# Patient Record
Sex: Male | Born: 1935 | Race: White | Hispanic: No | Marital: Married | Smoking: Current every day smoker
Health system: Southern US, Community
[De-identification: ages and names within clinical notes are randomized; demographics above are authoritative.]

## PROBLEM LIST (undated history)

## (undated) DIAGNOSIS — H269 Unspecified cataract: Secondary | ICD-10-CM

## (undated) DIAGNOSIS — J449 Chronic obstructive pulmonary disease, unspecified: Secondary | ICD-10-CM

## (undated) DIAGNOSIS — H9190 Unspecified hearing loss, unspecified ear: Secondary | ICD-10-CM

## (undated) DIAGNOSIS — IMO0001 Reserved for inherently not codable concepts without codable children: Secondary | ICD-10-CM

## (undated) HISTORY — PX: SHOULDER SURGERY: SHX246

## (undated) HISTORY — PX: CATARACT EXTRACTION: SUR2

## (undated) HISTORY — PX: KNEE SURGERY: SHX244

## (undated) HISTORY — PX: BACK SURGERY: SHX140

## (undated) HISTORY — DX: Unspecified cataract: H26.9

---

## 2014-07-08 DEATH — deceased

## 2015-01-09 ENCOUNTER — Other Ambulatory Visit: Payer: Self-pay | Admitting: Internal Medicine

## 2015-01-09 ENCOUNTER — Ambulatory Visit
Admission: RE | Admit: 2015-01-09 | Discharge: 2015-01-09 | Disposition: A | Discharge status: Home / Self Care | Payer: Medicare Other | Source: Ambulatory Visit | Attending: Internal Medicine | Admitting: Internal Medicine

## 2015-01-09 DIAGNOSIS — R05 Cough: Secondary | ICD-10-CM | POA: Diagnosis present

## 2015-01-09 DIAGNOSIS — R634 Abnormal weight loss: Secondary | ICD-10-CM | POA: Diagnosis present

## 2015-01-09 DIAGNOSIS — R918 Other nonspecific abnormal finding of lung field: Secondary | ICD-10-CM | POA: Insufficient documentation

## 2015-01-09 DIAGNOSIS — R059 Cough, unspecified: Secondary | ICD-10-CM

## 2015-01-20 ENCOUNTER — Other Ambulatory Visit: Payer: Self-pay | Admitting: Internal Medicine

## 2015-01-20 DIAGNOSIS — R918 Other nonspecific abnormal finding of lung field: Secondary | ICD-10-CM

## 2015-01-23 ENCOUNTER — Ambulatory Visit
Admission: RE | Admit: 2015-01-23 | Discharge: 2015-01-23 | Disposition: A | Discharge status: Home / Self Care | Payer: Medicare Other | Source: Ambulatory Visit | Attending: Internal Medicine | Admitting: Internal Medicine

## 2015-01-23 DIAGNOSIS — J439 Emphysema, unspecified: Secondary | ICD-10-CM | POA: Diagnosis not present

## 2015-01-23 DIAGNOSIS — I7090 Unspecified atherosclerosis: Secondary | ICD-10-CM | POA: Diagnosis not present

## 2015-01-23 DIAGNOSIS — R918 Other nonspecific abnormal finding of lung field: Secondary | ICD-10-CM | POA: Diagnosis present

## 2015-01-23 MED ORDER — IOHEXOL 300 MG/ML  SOLN
75.0000 mL | Freq: Once | INTRAMUSCULAR | Status: AC | PRN
  Administered 2015-01-23: 75 mL via INTRAVENOUS

## 2015-01-28 ENCOUNTER — Encounter: Payer: Self-pay | Admitting: Oncology

## 2015-01-28 ENCOUNTER — Inpatient Hospital Stay: Payer: Medicare Other | Attending: Oncology | Admitting: Oncology

## 2015-01-28 VITALS — BP 152/89 | HR 90 | Temp 96.3°F | Resp 16 | Ht 68.7 in | Wt 120.2 lb

## 2015-01-28 DIAGNOSIS — R911 Solitary pulmonary nodule: Secondary | ICD-10-CM

## 2015-01-28 DIAGNOSIS — R0602 Shortness of breath: Secondary | ICD-10-CM | POA: Diagnosis not present

## 2015-01-28 DIAGNOSIS — H9190 Unspecified hearing loss, unspecified ear: Secondary | ICD-10-CM | POA: Insufficient documentation

## 2015-01-28 DIAGNOSIS — Z79899 Other long term (current) drug therapy: Secondary | ICD-10-CM | POA: Diagnosis not present

## 2015-01-28 DIAGNOSIS — Z7982 Long term (current) use of aspirin: Secondary | ICD-10-CM | POA: Diagnosis not present

## 2015-01-28 DIAGNOSIS — R079 Chest pain, unspecified: Secondary | ICD-10-CM | POA: Insufficient documentation

## 2015-01-28 DIAGNOSIS — R634 Abnormal weight loss: Secondary | ICD-10-CM | POA: Diagnosis not present

## 2015-01-28 DIAGNOSIS — J449 Chronic obstructive pulmonary disease, unspecified: Secondary | ICD-10-CM | POA: Insufficient documentation

## 2015-01-28 DIAGNOSIS — R918 Other nonspecific abnormal finding of lung field: Secondary | ICD-10-CM | POA: Diagnosis not present

## 2015-01-28 DIAGNOSIS — F17210 Nicotine dependence, cigarettes, uncomplicated: Secondary | ICD-10-CM | POA: Insufficient documentation

## 2015-01-28 DIAGNOSIS — F0390 Unspecified dementia without behavioral disturbance: Secondary | ICD-10-CM | POA: Diagnosis not present

## 2015-01-28 NOTE — Progress Notes (Signed)
Patient's daughter and wife spoke with me individually today, he is not aware of the mass that was seen recent CT.  The family would like for Dr. Grayland Ormond not to mention his diagnosis to the patient because he is not aware.

## 2015-01-29 ENCOUNTER — Telehealth: Payer: Self-pay | Admitting: *Deleted

## 2015-01-29 NOTE — Telephone Encounter (Signed)
  Oncology Nurse Navigator Documentation    Navigator Encounter Type: Introductory phone call (01/29/15 0900)               Phone call to spouse as followup from initial medical oncology appointment. Introduced Programmer, multimedia and reviewed plan of care. Will follow and assist in setting up navigation bronchoscopy.

## 2015-02-03 ENCOUNTER — Other Ambulatory Visit: Payer: Medicare Other

## 2015-02-04 ENCOUNTER — Encounter: Payer: Self-pay | Admitting: Internal Medicine

## 2015-02-04 ENCOUNTER — Encounter: Payer: Self-pay | Admitting: *Deleted

## 2015-02-04 ENCOUNTER — Ambulatory Visit (INDEPENDENT_AMBULATORY_CARE_PROVIDER_SITE_OTHER): Payer: Medicare Other | Admitting: Internal Medicine

## 2015-02-04 VITALS — BP 108/68 | HR 97 | Ht 68.0 in | Wt 120.0 lb

## 2015-02-04 DIAGNOSIS — J439 Emphysema, unspecified: Secondary | ICD-10-CM

## 2015-02-04 DIAGNOSIS — R918 Other nonspecific abnormal finding of lung field: Secondary | ICD-10-CM

## 2015-02-04 MED ORDER — TIOTROPIUM BROMIDE MONOHYDRATE 18 MCG IN CAPS: 18.0000 ug | ORAL_CAPSULE | Freq: Every day | RESPIRATORY_TRACT | Status: AC

## 2015-02-04 MED ORDER — ALBUTEROL SULFATE HFA 108 (90 BASE) MCG/ACT IN AERS: 2.0000 | INHALATION_SPRAY | Freq: Four times a day (QID) | RESPIRATORY_TRACT | Status: AC | PRN

## 2015-02-04 MED ORDER — MOMETASONE FURO-FORMOTEROL FUM 200-5 MCG/ACT IN AERO: 2.0000 | INHALATION_SPRAY | Freq: Two times a day (BID) | RESPIRATORY_TRACT | Status: AC

## 2015-02-04 NOTE — Progress Notes (Signed)
[facility] Consultation      Date: 02/04/2015,   MRN# [id number] Brian Coleman [birth year] Code Status:  Hosp day:'@LENGTHOFSTAYDAYS'$ @ Referring MD: '@ATDPROV'$ @     PCP:      AdmissionWeight: 120 lb (54.432 kg)                 CurrentWeight: 120 lb (54.432 kg) Brian Coleman is a 81 y.o. old male seen in consultation for Lung mass    CHIEF COMPLAINT:   Abnormal CT chest  a  HISTORY OF PRESENT ILLNESS   81 yo white male seen today for abnormal CT chest, patient with spiculated RUL mass Patient has been having Some SOB for several months, CXR shows RUL abnormality Patient has increased WOb that resolves with rest, he has no acute issues at this time   Patient saw Dr Grayland Ormond last week and was seen today for Ct chest which was reviewed with family-i have showed them areas of emphysema and the mass Patient has had approx 25 pound weight loss over the last several years, most of information was obtained by Wife and daughter  Patient has no fevers, chills, NVD   PAST MEDICAL HISTORY   Past Medical History  Diagnosis Date  . Cataract      SURGICAL HISTORY   Past Surgical History  Procedure Laterality Date  . Knee surgery       FAMILY HISTORY   No family history on file.   SOCIAL HISTORY   Social History  Substance Use Topics  . Smoking status: Current Every Day Smoker -- 0.75 packs/day for 60 years    Types: Cigarettes  . Smokeless tobacco: Never Used  . Alcohol Use: No     MEDICATIONS    Home Medication:  Current Outpatient Rx  Name  Route  Sig  Dispense  Refill  . aspirin 81 MG tablet   Oral   Take 81 mg by mouth daily.           Current Medication:  Current outpatient prescriptions:  .  aspirin 81 MG tablet, Take 81 mg by mouth daily., Disp: , Rfl:     ALLERGIES   Codeine     REVIEW OF SYSTEMS   Review of Systems  Constitutional: Negative for fever, chills, weight loss, malaise/fatigue and diaphoresis.    HENT: Negative for congestion and hearing loss.   Eyes: Negative for blurred vision and double vision.  Respiratory: Positive for shortness of breath. Negative for cough and wheezing.   Cardiovascular: Negative for chest pain, palpitations and orthopnea.  Gastrointestinal: Negative for heartburn, nausea, vomiting, abdominal pain, diarrhea, constipation and blood in stool.  Genitourinary: Negative for dysuria and urgency.  Musculoskeletal: Negative for myalgias, back pain and neck pain.  Skin: Negative for rash.  Neurological: Negative for dizziness, tingling, tremors, weakness and headaches.  Endo/Heme/Allergies: Does not bruise/bleed easily.  Psychiatric/Behavioral: Negative for depression, suicidal ideas and substance abuse.  All other systems reviewed and are negative.    VS: Ht '5\' 8"'$  (1.727 m)  Wt 120 lb (54.432 kg)  BMI 18.25 kg/m2     PHYSICAL EXAM  Physical Exam  Constitutional: He is oriented to person, place, and time. He appears well-developed and well-nourished. No distress.  Thin and cachectic  HENT:  Head: Normocephalic and atraumatic.  Mouth/Throat: No oropharyngeal exudate.  Eyes: EOM are normal. Pupils are equal, round, and reactive to light. No scleral icterus.  Neck: Normal range of motion. Neck supple.  Cardiovascular: Normal rate, regular rhythm and  normal heart sounds.   No murmur heard. Pulmonary/Chest: No stridor. No respiratory distress. He has no wheezes.  Abdominal: Soft. Bowel sounds are normal. He exhibits no distension. There is no tenderness. There is no rebound.  Musculoskeletal: Normal range of motion. He exhibits no edema.  Neurological: He is alert and oriented to person, place, and time. He displays normal reflexes. Coordination normal.  Skin: Skin is warm. He is not diaphoretic.  Psychiatric: He has a normal mood and affect.        LABS    No results for input(s): HGB, HCT, MCV, WBC, POTASSIUM, CHLORIDE, BUN, CREATININE, GLUCOSE,  CALCIUM, INR, PTT in the last 72 hours.  Invalid input(s): PLATELET, BANDS, NEUTROPHIL, LYMPHOCYTE, MONOCYTE, EOSINOPHILS, BASOPHIL, SODIUM, BICARBONATE, MAGNESIUM, PHOSPHORUS, PT, SGPT, SGOT,    No results for input(s): PH in the last 72 hours.  Invalid input(s): PCO2, PO2, BASEEXCESS, BASEDEFICITE, TFT    CULTURE RESULTS   No results found for this or any previous visit (from the past 240 hour(s)).        IMAGING    Dg Chest 2 View  01/10/2015   CLINICAL DATA:  Shortness of breath and weight loss  EXAM: CHEST  2 VIEW  COMPARISON:  None.  FINDINGS: There is an irregular nodular opacity in the right upper lobe, anterior segment, measuring 3.9 x 2.8 x 2.5 cm. There is evidence of underlying COPD. Lungs elsewhere clear. The heart size is normal. Pulmonary vascular reflects underlying COPD. No adenopathy. There is degenerative change in the thoracic spine.  IMPRESSION: Irregular opacity in the anterior segment right upper lobe concerning for neoplasm. Advise contrast enhanced chest CT to further evaluate. Underlying COPD.  These results will be called to the ordering clinician or representative by the Radiologist Assistant, and communication documented in the PACS or zVision Dashboard.   Electronically Signed   By: Lowella Grip III M.D.   On: 01/10/2015 08:04   Ct Chest W Contrast  01/23/2015   CLINICAL DATA:  Irregular right upper lobe mass on chest radiographs, concerning for malignancy. No history of malignancy. Patient fell 2 weeks ago. Initial encounter.  EXAM: CT CHEST WITH CONTRAST  TECHNIQUE: Multidetector CT imaging of the chest was performed during intravenous contrast administration.  CONTRAST:  46m OMNIPAQUE IOHEXOL 300 MG/ML  SOLN  COMPARISON:  Chest radiographs 01/09/2015.  FINDINGS: Mediastinum/Nodes: There are no enlarged mediastinal, hilar or axillary lymph nodes. There are probable dependent secretions within the trachea and right mainstem bronchus. The thyroid gland and  esophagus demonstrate no significant findings. The heart size is normal. There is no pericardial effusion. There is atherosclerosis of the aorta, great vessels and coronary arteries.  Lungs/Pleura: There is no pleural effusion. Moderate diffuse changes of centrilobular and paraseptal emphysema. As demonstrated radiographically, there is an irregular right upper lobe mass which measures 3.3 x 4.2 x 2.9 cm, consistent with bronchogenic carcinoma. No other suspicious pulmonary nodules. There is mild central airway thickening.  Upper abdomen: Multiple well-circumscribed water density hepatic lesions are consistent with cysts. Some of these are too small to optimally characterize, but no suspicious lesions demonstrated. No evidence of adrenal mass.  Musculoskeletal/Chest wall: There is no chest wall mass or suspicious osseous finding. Mild thoracic spine degenerative changes are present.  IMPRESSION: 1. Spiculated right upper lobe mass consistent with bronchogenic carcinoma. No evidence of metastatic disease. 2. Moderate underlying emphysema. 3. Mild atherosclerosis. 4. Probable hepatic cysts.   Electronically Signed   By: WRichardean SaleM.D.   On:  01/23/2015 14:03           ASSESSMENT/PLAN   81 yo white male seen today for SOB and Lung mass: findings c/w COPD with emphysema and Primary Lung cancer  Lung mass -Plan for Diagnostic Procedure  The Risks and Benefits of the Bronchoscopy with ENB were explained to patient/family and I have discussed the risk for acute bleeding, increased chance of infection, increased chance of respiratory failure and cardiac arrest and death. I have also explained to avoid all types of NSAIDs to decrease chance of bleeding, and to avoid food and drinks the midnight prior to procedure.  The patient/family understand the risks and benefits and have agreed to proceed with procedure.    COPD -prescribed spiriva/dulera/albuterol as needed -smoking cessation  advised     I have personally obtained a history, examined the patient, evaluated laboratory and independently reviewed imaging results, formulated the assessment and plan and placed orders.  The Patient requires high complexity decision making for assessment and support, frequent evaluation and titration of therapies, application of advanced monitoring technologies and extensive interpretation of multiple databases. Time spent with patient 45 minutes.  Patient is satisfied with Plan of action and management.    Corrin Parker, M.D.  [facility]  Medical Director [place] Director [facility] Cardio-Pulmonary Department

## 2015-02-04 NOTE — Progress Notes (Signed)
Met with patient, wife and daughter, Lattie Haw at pulmonary consultation. Will follow and assist with coordination of care including enb. Patient and family are aware of phone preop tomorrow and enb on Thursday as well as being npo after midnight and avoiding anticoagulant medications.

## 2015-02-04 NOTE — Assessment & Plan Note (Signed)
-  Plan for Diagnostic Procedure  The Risks and Benefits of the Bronchoscopy with ENB were explained to patient/family and I have discussed the risk for acute bleeding, increased chance of infection, increased chance of respiratory failure and cardiac arrest and death. I have also explained to avoid all types of NSAIDs to decrease chance of bleeding, and to avoid food and drinks the midnight prior to procedure.  The patient/family understand the risks and benefits and have agreed to proceed with procedure.

## 2015-02-04 NOTE — Progress Notes (Signed)
   Subjective:    Patient ID: Brian Coleman, male    DOB: 1934/04/20, 81 y.o.   MRN: [id number]  HPI    Review of Systems  Constitutional: Negative for fever and unexpected weight change.  HENT: Negative for congestion, dental problem, ear pain, nosebleeds, postnasal drip, rhinorrhea, sinus pressure, sneezing, sore throat and trouble swallowing.   Eyes: Negative for redness and itching.  Respiratory: Positive for chest tightness and shortness of breath. Negative for cough and wheezing.   Cardiovascular: Negative for palpitations and leg swelling.  Gastrointestinal: Negative for nausea and vomiting.  Genitourinary: Negative for dysuria.  Musculoskeletal: Negative for joint swelling.  Skin: Negative for rash.  Neurological: Negative for headaches.  Hematological: Does not bruise/bleed easily.  Psychiatric/Behavioral: Negative for dysphoric mood. The patient is not nervous/anxious.        Objective:   Physical Exam        Assessment & Plan:

## 2015-02-05 ENCOUNTER — Encounter: Payer: Self-pay | Admitting: *Deleted

## 2015-02-05 ENCOUNTER — Other Ambulatory Visit: Payer: Medicare Other

## 2015-02-05 NOTE — Progress Notes (Signed)
[facility]  Telephone:([phone]) [phone] Fax:([phone]) [phone]  ID: Lance Bosch OB: 28-Mar-1934  MR#: [id number]  HEN#:[id number]  Patient Care Team: Albina Billet, MD as PCP - General (Internal Medicine)  CHIEF COMPLAINT:  Chief Complaint  Patient presents with  . New Evaluation    lung mass    INTERVAL HISTORY: Patient is a 81 year old male with dementia who is complaining of chest pain and shortness of breath. Subsequent workup included CT scan which revealed a right upper lobe mass. Much of the history is given by his wife and daughter. They have also requested not to mention the word "cancer" in front of the patient until they are sure of the diagnosis. He otherwise has felt well. There is report of some weight loss, but unclear how much. There are no neurologic complaints. He denies any nausea, vomiting, constipation, or diarrhea. He has no urinary complaints. Patient otherwise feels well and offers no further specific complaints.  REVIEW OF SYSTEMS:   Review of Systems  Constitutional: Positive for weight loss. Negative for fever.  HENT: Negative.   Respiratory: Positive for shortness of breath.   Cardiovascular: Positive for chest pain.  Gastrointestinal: Negative.   Neurological: Negative.   Psychiatric/Behavioral: Positive for memory loss.    As per HPI. Otherwise, a complete review of systems is negatve.  PAST MEDICAL HISTORY: Past Medical History  Diagnosis Date  . Cataract   . COPD (chronic obstructive pulmonary disease)   . Shortness of breath dyspnea   . HOH (hard of hearing)     PAST SURGICAL HISTORY: Past Surgical History  Procedure Laterality Date  . Knee surgery    . Back surgery    . Cataract extraction    . Shoulder surgery      FAMILY HISTORY: Reviewed and unchanged. No reported history of malignancy or chronic disease.     ADVANCED DIRECTIVES:    HEALTH MAINTENANCE: Social History  Substance Use Topics  . Smoking status:  Current Every Day Smoker -- 0.75 packs/day for 60 years    Types: Cigarettes  . Smokeless tobacco: Never Used  . Alcohol Use: No     Colonoscopy:  PAP:  Bone density:  Lipid panel:  Allergies  Allergen Reactions  . Codeine     hallucinations    Current Outpatient Prescriptions  Medication Sig Dispense Refill  . aspirin 81 MG tablet Take 81 mg by mouth daily.    Marland Kitchen albuterol (PROVENTIL HFA;VENTOLIN HFA) 108 (90 BASE) MCG/ACT inhaler Inhale 2 puffs into the lungs every 6 (six) hours as needed for wheezing or shortness of breath. 1 Inhaler 2  . lactose free nutrition (BOOST) LIQD Take 237 mLs by mouth daily.    . mometasone-formoterol (DULERA) 200-5 MCG/ACT AERO Inhale 2 puffs into the lungs 2 (two) times daily. 1 Inhaler 12  . tiotropium (SPIRIVA HANDIHALER) 18 MCG inhalation capsule Place 1 capsule (18 mcg total) into inhaler and inhale daily. 30 capsule 2   No current facility-administered medications for this visit.    OBJECTIVE: Filed Vitals:   01/28/15 1639  BP: 152/89  Pulse: 90  Temp: 96.3 F (35.7 C)  Resp: 16     Body mass index is 17.9 kg/(m^2).    ECOG FS:0 - Asymptomatic  General: Well-developed, well-nourished, no acute distress. Eyes: Pink conjunctiva, anicteric sclera. HEENT: Normocephalic, moist mucous membranes, clear oropharnyx. Lungs: Clear to auscultation bilaterally. Heart: Regular rate and rhythm. No rubs, murmurs, or gallops. Abdomen: Soft, nontender, nondistended. No organomegaly noted, normoactive bowel  sounds. Musculoskeletal: No edema, cyanosis, or clubbing. Neuro: Alert, intermittently confused. Cranial nerves grossly intact. Skin: No rashes or petechiae noted. Psych: Normal affect.   LAB RESULTS:  No results found for: NA, K, CL, CO2, GLUCOSE, BUN, CREATININE, CALCIUM, PROT, ALBUMIN, AST, ALT, ALKPHOS, BILITOT, GFRNONAA, GFRAA  No results found for: WBC, NEUTROABS, HGB, HCT, MCV, PLT   STUDIES: Dg Chest 2 View  01/10/2015   CLINICAL  DATA:  Shortness of breath and weight loss  EXAM: CHEST  2 VIEW  COMPARISON:  None.  FINDINGS: There is an irregular nodular opacity in the right upper lobe, anterior segment, measuring 3.9 x 2.8 x 2.5 cm. There is evidence of underlying COPD. Lungs elsewhere clear. The heart size is normal. Pulmonary vascular reflects underlying COPD. No adenopathy. There is degenerative change in the thoracic spine.  IMPRESSION: Irregular opacity in the anterior segment right upper lobe concerning for neoplasm. Advise contrast enhanced chest CT to further evaluate. Underlying COPD.  These results will be called to the ordering clinician or representative by the Radiologist Assistant, and communication documented in the PACS or zVision Dashboard.   Electronically Signed   By: Lowella Grip III M.D.   On: 01/10/2015 08:04   Ct Chest W Contrast  01/23/2015   CLINICAL DATA:  Irregular right upper lobe mass on chest radiographs, concerning for malignancy. No history of malignancy. Patient fell 2 weeks ago. Initial encounter.  EXAM: CT CHEST WITH CONTRAST  TECHNIQUE: Multidetector CT imaging of the chest was performed during intravenous contrast administration.  CONTRAST:  64m OMNIPAQUE IOHEXOL 300 MG/ML  SOLN  COMPARISON:  Chest radiographs 01/09/2015.  FINDINGS: Mediastinum/Nodes: There are no enlarged mediastinal, hilar or axillary lymph nodes. There are probable dependent secretions within the trachea and right mainstem bronchus. The thyroid gland and esophagus demonstrate no significant findings. The heart size is normal. There is no pericardial effusion. There is atherosclerosis of the aorta, great vessels and coronary arteries.  Lungs/Pleura: There is no pleural effusion. Moderate diffuse changes of centrilobular and paraseptal emphysema. As demonstrated radiographically, there is an irregular right upper lobe mass which measures 3.3 x 4.2 x 2.9 cm, consistent with bronchogenic carcinoma. No other suspicious pulmonary  nodules. There is mild central airway thickening.  Upper abdomen: Multiple well-circumscribed water density hepatic lesions are consistent with cysts. Some of these are too small to optimally characterize, but no suspicious lesions demonstrated. No evidence of adrenal mass.  Musculoskeletal/Chest wall: There is no chest wall mass or suspicious osseous finding. Mild thoracic spine degenerative changes are present.  IMPRESSION: 1. Spiculated right upper lobe mass consistent with bronchogenic carcinoma. No evidence of metastatic disease. 2. Moderate underlying emphysema. 3. Mild atherosclerosis. 4. Probable hepatic cysts.   Electronically Signed   By: WRichardean SaleM.D.   On: 01/23/2015 14:03    ASSESSMENT: 4 cm right upper lobe lung mass, highly suspicious for malignancy.   PLAN:    1. Lung mass: Highly suspicious for underlying malignancy. Will refer patient to pulmonology for consideration of navigational bronchoscopy to obtain a diagnosis. Given his underlying dementia, treatment for malignancy may be difficult. If family wishes to pursue treatment, he will require a PET scan and MRI of the brain to complete the staging workup. Return to clinic approximately one week after his biopsy to discuss the results and any further diagnostic or treatment planning if necessary.  Approximately 45 minutes was spent in discussion and consultation.  Patient expressed understanding and was in agreement with this plan. He also  understands that He can call clinic at any time with any questions, concerns, or complaints.   No matching staging information was found for the patient.  Lloyd Huger, MD   02/05/2015 3:20 PM

## 2015-02-05 NOTE — Patient Instructions (Signed)
  Your procedure is scheduled on: 02-06-15 Report to [place] To find out your arrival time please call [phone] between 1PM - 3PM on 02-05-15  Remember: Instructions that are not followed completely may result in serious medical risk, up to and including death, or upon the discretion of your surgeon and anesthesiologist your surgery may need to be rescheduled.    __X__ 1. Do not eat food or drink liquids after midnight. No gum chewing or hard candies.     __X__ 2. No Alcohol for 24 hours before or after surgery.   ____ 3. Bring all medications with you on the day of surgery if instructed.    ____ 4. Notify your doctor if there is any change in your medical condition     (cold, fever, infections).     Do not wear jewelry, make-up, hairpins, clips or nail polish.  Do not wear lotions, powders, or perfumes. You may wear deodorant.  Do not shave 48 hours prior to surgery. Men may shave face and neck.  Do not bring valuables to the hospital.    [facility] is not responsible for any belongings or valuables.               Contacts, dentures or bridgework may not be worn into surgery.  Leave your suitcase in the car. After surgery it may be brought to your room.  For patients admitted to the hospital, discharge time is determined by your treatment team.   Patients discharged the day of surgery will not be allowed to drive home.   Please read over the following fact sheets that you were given:      ____ Take these medicines the morning of surgery with A SIP OF WATER:    1.NONE  2.   3.   4.  5.  6.  ____ Fleet Enema (as directed)   ____ Use CHG Soap as directed  _X___ Use inhalers on the day of surgery  ____ Stop metformin 2 days prior to surgery    ____ Take 1/2 of usual insulin dose the night before surgery and none on the morning of surgery.   ____ Stop Coumadin/Plavix/aspirin-PT STOPPED ASA LAST WEEK PER WIFE  ____ Stop  Anti-inflammatories-NO NSAIDS OR ASA PRODUCTS-TYLENOL OK   ____ Stop supplements until after surgery.    ____ Bring C-Pap to the hospital.

## 2015-02-06 ENCOUNTER — Encounter: Payer: Self-pay | Admitting: Anesthesiology

## 2015-02-06 ENCOUNTER — Ambulatory Visit: Payer: Medicare Other

## 2015-02-06 ENCOUNTER — Ambulatory Visit: Payer: Medicare Other | Admitting: Anesthesiology

## 2015-02-06 ENCOUNTER — Encounter
Admission: RE | Disposition: A | Discharge status: Home / Self Care | Payer: Self-pay | Source: Ambulatory Visit | Attending: Internal Medicine

## 2015-02-06 ENCOUNTER — Ambulatory Visit
Admission: RE | Admit: 2015-02-06 | Discharge: 2015-02-06 | Disposition: A | Discharge status: Home / Self Care | Payer: Medicare Other | Source: Ambulatory Visit | Attending: Internal Medicine | Admitting: Internal Medicine

## 2015-02-06 DIAGNOSIS — C3411 Malignant neoplasm of upper lobe, right bronchus or lung: Secondary | ICD-10-CM | POA: Diagnosis not present

## 2015-02-06 DIAGNOSIS — H9190 Unspecified hearing loss, unspecified ear: Secondary | ICD-10-CM | POA: Diagnosis not present

## 2015-02-06 DIAGNOSIS — J449 Chronic obstructive pulmonary disease, unspecified: Secondary | ICD-10-CM | POA: Insufficient documentation

## 2015-02-06 DIAGNOSIS — F0390 Unspecified dementia without behavioral disturbance: Secondary | ICD-10-CM | POA: Diagnosis not present

## 2015-02-06 DIAGNOSIS — Z7982 Long term (current) use of aspirin: Secondary | ICD-10-CM | POA: Diagnosis not present

## 2015-02-06 DIAGNOSIS — Z01818 Encounter for other preprocedural examination: Secondary | ICD-10-CM | POA: Diagnosis not present

## 2015-02-06 DIAGNOSIS — R0602 Shortness of breath: Secondary | ICD-10-CM | POA: Diagnosis not present

## 2015-02-06 DIAGNOSIS — F17210 Nicotine dependence, cigarettes, uncomplicated: Secondary | ICD-10-CM | POA: Diagnosis not present

## 2015-02-06 DIAGNOSIS — R918 Other nonspecific abnormal finding of lung field: Secondary | ICD-10-CM | POA: Diagnosis present

## 2015-02-06 DIAGNOSIS — Z885 Allergy status to narcotic agent status: Secondary | ICD-10-CM | POA: Insufficient documentation

## 2015-02-06 HISTORY — PX: ELECTROMAGNETIC NAVIGATION BROCHOSCOPY: SHX5369

## 2015-02-06 HISTORY — DX: Unspecified hearing loss, unspecified ear: H91.90

## 2015-02-06 HISTORY — DX: Chronic obstructive pulmonary disease, unspecified: J44.9

## 2015-02-06 HISTORY — DX: Reserved for inherently not codable concepts without codable children: IMO0001

## 2015-02-06 SURGERY — ELECTROMAGNETIC NAVIGATION BRONCHOSCOPY
Anesthesia: General

## 2015-02-06 MED ORDER — FENTANYL CITRATE (PF) 100 MCG/2ML IJ SOLN
INTRAMUSCULAR | Status: DC | PRN
  Administered 2015-02-06: 2 ug via INTRAVENOUS

## 2015-02-06 MED ORDER — PROPOFOL 10 MG/ML IV BOLUS
INTRAVENOUS | Status: DC | PRN
  Administered 2015-02-06: 70 mg via INTRAVENOUS
  Administered 2015-02-06: 40 mg via INTRAVENOUS

## 2015-02-06 MED ORDER — SUCCINYLCHOLINE CHLORIDE 20 MG/ML IJ SOLN
INTRAMUSCULAR | Status: DC | PRN
  Administered 2015-02-06: 100 mg via INTRAVENOUS

## 2015-02-06 MED ORDER — LACTATED RINGERS IV SOLN
INTRAVENOUS | Status: DC
  Administered 2015-02-06: 12:00:00 via INTRAVENOUS

## 2015-02-06 MED ORDER — ONDANSETRON HCL 4 MG/2ML IJ SOLN
INTRAMUSCULAR | Status: DC | PRN
  Administered 2015-02-06: 4 mg via INTRAVENOUS

## 2015-02-06 MED ORDER — MIDAZOLAM HCL 5 MG/5ML IJ SOLN
INTRAMUSCULAR | Status: DC | PRN
  Administered 2015-02-06: 1 mg via INTRAVENOUS

## 2015-02-06 MED ORDER — PHENYLEPHRINE HCL 10 MG/ML IJ SOLN
INTRAMUSCULAR | Status: DC | PRN
  Administered 2015-02-06: 100 ug via INTRAVENOUS

## 2015-02-06 MED ORDER — SUGAMMADEX SODIUM 500 MG/5ML IV SOLN
INTRAVENOUS | Status: DC | PRN
  Administered 2015-02-06: 120 mg via INTRAVENOUS

## 2015-02-06 MED ORDER — FAMOTIDINE 20 MG PO TABS
20.0000 mg | ORAL_TABLET | Freq: Once | ORAL | Status: AC
  Administered 2015-02-06: 20 mg via ORAL

## 2015-02-06 MED ORDER — FAMOTIDINE 20 MG PO TABS
ORAL_TABLET | ORAL | Status: AC
  Administered 2015-02-06: 20 mg via ORAL
  Filled 2015-02-06: qty 1

## 2015-02-06 MED ORDER — LIDOCAINE HCL (CARDIAC) 20 MG/ML IV SOLN
INTRAVENOUS | Status: DC | PRN
  Administered 2015-02-06: 20 mg via INTRAVENOUS

## 2015-02-06 MED ORDER — ROCURONIUM BROMIDE 100 MG/10ML IV SOLN
INTRAVENOUS | Status: DC | PRN
  Administered 2015-02-06 (×2): 5 mg via INTRAVENOUS

## 2015-02-06 NOTE — Anesthesia Procedure Notes (Signed)
Procedure Name: Intubation Date/Time: 02/06/2015 1:19 PM Performed by: Dionne Bucy Pre-anesthesia Checklist: Patient identified, Patient being monitored, Timeout performed, Emergency Drugs available and Suction available Patient Re-evaluated:Patient Re-evaluated prior to inductionOxygen Delivery Method: Circle system utilized Preoxygenation: Pre-oxygenation with 100% oxygen Intubation Type: IV induction Ventilation: Mask ventilation without difficulty Laryngoscope Size: Mac and 4 Grade View: Grade I Tube type: Oral Tube size: 8.5 mm Number of attempts: 1 Airway Equipment and Method: Stylet Placement Confirmation: ETT inserted through vocal cords under direct vision,  positive ETCO2 and breath sounds checked- equal and bilateral Secured at: 22 cm Tube secured with: Tape Dental Injury: Teeth and Oropharynx as per pre-operative assessment

## 2015-02-06 NOTE — H&P (View-Only) (Signed)
[facility] Consultation      Date: 02/04/2015,   MRN# [id number] Jaythen Hamme [birth year] Code Status:  Hosp day:'@LENGTHOFSTAYDAYS'$ @ Referring MD: '@ATDPROV'$ @     PCP:      AdmissionWeight: 120 lb (54.432 kg)                 CurrentWeight: 120 lb (54.432 kg) Devin Ganaway is a 81 y.o. old male seen in consultation for Lung mass    CHIEF COMPLAINT:   Abnormal CT chest  a  HISTORY OF PRESENT ILLNESS   81 yo white male seen today for abnormal CT chest, patient with spiculated RUL mass Patient has been having Some SOB for several months, CXR shows RUL abnormality Patient has increased WOb that resolves with rest, he has no acute issues at this time   Patient saw Dr Grayland Ormond last week and was seen today for Ct chest which was reviewed with family-i have showed them areas of emphysema and the mass Patient has had approx 25 pound weight loss over the last several years, most of information was obtained by Wife and daughter  Patient has no fevers, chills, NVD   PAST MEDICAL HISTORY   Past Medical History  Diagnosis Date  . Cataract      SURGICAL HISTORY   Past Surgical History  Procedure Laterality Date  . Knee surgery       FAMILY HISTORY   No family history on file.   SOCIAL HISTORY   Social History  Substance Use Topics  . Smoking status: Current Every Day Smoker -- 0.75 packs/day for 60 years    Types: Cigarettes  . Smokeless tobacco: Never Used  . Alcohol Use: No     MEDICATIONS    Home Medication:  Current Outpatient Rx  Name  Route  Sig  Dispense  Refill  . aspirin 81 MG tablet   Oral   Take 81 mg by mouth daily.           Current Medication:  Current outpatient prescriptions:  .  aspirin 81 MG tablet, Take 81 mg by mouth daily., Disp: , Rfl:     ALLERGIES   Codeine     REVIEW OF SYSTEMS   Review of Systems  Constitutional: Negative for fever, chills, weight loss, malaise/fatigue and diaphoresis.    HENT: Negative for congestion and hearing loss.   Eyes: Negative for blurred vision and double vision.  Respiratory: Positive for shortness of breath. Negative for cough and wheezing.   Cardiovascular: Negative for chest pain, palpitations and orthopnea.  Gastrointestinal: Negative for heartburn, nausea, vomiting, abdominal pain, diarrhea, constipation and blood in stool.  Genitourinary: Negative for dysuria and urgency.  Musculoskeletal: Negative for myalgias, back pain and neck pain.  Skin: Negative for rash.  Neurological: Negative for dizziness, tingling, tremors, weakness and headaches.  Endo/Heme/Allergies: Does not bruise/bleed easily.  Psychiatric/Behavioral: Negative for depression, suicidal ideas and substance abuse.  All other systems reviewed and are negative.    VS: Ht '5\' 8"'$  (1.727 m)  Wt 120 lb (54.432 kg)  BMI 18.25 kg/m2     PHYSICAL EXAM  Physical Exam  Constitutional: He is oriented to person, place, and time. He appears well-developed and well-nourished. No distress.  Thin and cachectic  HENT:  Head: Normocephalic and atraumatic.  Mouth/Throat: No oropharyngeal exudate.  Eyes: EOM are normal. Pupils are equal, round, and reactive to light. No scleral icterus.  Neck: Normal range of motion. Neck supple.  Cardiovascular: Normal rate, regular rhythm and  normal heart sounds.   No murmur heard. Pulmonary/Chest: No stridor. No respiratory distress. He has no wheezes.  Abdominal: Soft. Bowel sounds are normal. He exhibits no distension. There is no tenderness. There is no rebound.  Musculoskeletal: Normal range of motion. He exhibits no edema.  Neurological: He is alert and oriented to person, place, and time. He displays normal reflexes. Coordination normal.  Skin: Skin is warm. He is not diaphoretic.  Psychiatric: He has a normal mood and affect.        LABS    No results for input(s): HGB, HCT, MCV, WBC, POTASSIUM, CHLORIDE, BUN, CREATININE, GLUCOSE,  CALCIUM, INR, PTT in the last 72 hours.  Invalid input(s): PLATELET, BANDS, NEUTROPHIL, LYMPHOCYTE, MONOCYTE, EOSINOPHILS, BASOPHIL, SODIUM, BICARBONATE, MAGNESIUM, PHOSPHORUS, PT, SGPT, SGOT,    No results for input(s): PH in the last 72 hours.  Invalid input(s): PCO2, PO2, BASEEXCESS, BASEDEFICITE, TFT    CULTURE RESULTS   No results found for this or any previous visit (from the past 240 hour(s)).        IMAGING    Dg Chest 2 View  01/10/2015   CLINICAL DATA:  Shortness of breath and weight loss  EXAM: CHEST  2 VIEW  COMPARISON:  None.  FINDINGS: There is an irregular nodular opacity in the right upper lobe, anterior segment, measuring 3.9 x 2.8 x 2.5 cm. There is evidence of underlying COPD. Lungs elsewhere clear. The heart size is normal. Pulmonary vascular reflects underlying COPD. No adenopathy. There is degenerative change in the thoracic spine.  IMPRESSION: Irregular opacity in the anterior segment right upper lobe concerning for neoplasm. Advise contrast enhanced chest CT to further evaluate. Underlying COPD.  These results will be called to the ordering clinician or representative by the Radiologist Assistant, and communication documented in the PACS or zVision Dashboard.   Electronically Signed   By: Lowella Grip III M.D.   On: 01/10/2015 08:04   Ct Chest W Contrast  01/23/2015   CLINICAL DATA:  Irregular right upper lobe mass on chest radiographs, concerning for malignancy. No history of malignancy. Patient fell 2 weeks ago. Initial encounter.  EXAM: CT CHEST WITH CONTRAST  TECHNIQUE: Multidetector CT imaging of the chest was performed during intravenous contrast administration.  CONTRAST:  44m OMNIPAQUE IOHEXOL 300 MG/ML  SOLN  COMPARISON:  Chest radiographs 01/09/2015.  FINDINGS: Mediastinum/Nodes: There are no enlarged mediastinal, hilar or axillary lymph nodes. There are probable dependent secretions within the trachea and right mainstem bronchus. The thyroid gland and  esophagus demonstrate no significant findings. The heart size is normal. There is no pericardial effusion. There is atherosclerosis of the aorta, great vessels and coronary arteries.  Lungs/Pleura: There is no pleural effusion. Moderate diffuse changes of centrilobular and paraseptal emphysema. As demonstrated radiographically, there is an irregular right upper lobe mass which measures 3.3 x 4.2 x 2.9 cm, consistent with bronchogenic carcinoma. No other suspicious pulmonary nodules. There is mild central airway thickening.  Upper abdomen: Multiple well-circumscribed water density hepatic lesions are consistent with cysts. Some of these are too small to optimally characterize, but no suspicious lesions demonstrated. No evidence of adrenal mass.  Musculoskeletal/Chest wall: There is no chest wall mass or suspicious osseous finding. Mild thoracic spine degenerative changes are present.  IMPRESSION: 1. Spiculated right upper lobe mass consistent with bronchogenic carcinoma. No evidence of metastatic disease. 2. Moderate underlying emphysema. 3. Mild atherosclerosis. 4. Probable hepatic cysts.   Electronically Signed   By: WRichardean SaleM.D.   On:  01/23/2015 14:03           ASSESSMENT/PLAN   81 yo white male seen today for SOB and Lung mass: findings c/w COPD with emphysema and Primary Lung cancer  Lung mass -Plan for Diagnostic Procedure  The Risks and Benefits of the Bronchoscopy with ENB were explained to patient/family and I have discussed the risk for acute bleeding, increased chance of infection, increased chance of respiratory failure and cardiac arrest and death. I have also explained to avoid all types of NSAIDs to decrease chance of bleeding, and to avoid food and drinks the midnight prior to procedure.  The patient/family understand the risks and benefits and have agreed to proceed with procedure.    COPD -prescribed spiriva/dulera/albuterol as needed -smoking cessation  advised     I have personally obtained a history, examined the patient, evaluated laboratory and independently reviewed imaging results, formulated the assessment and plan and placed orders.  The Patient requires high complexity decision making for assessment and support, frequent evaluation and titration of therapies, application of advanced monitoring technologies and extensive interpretation of multiple databases. Time spent with patient 45 minutes.  Patient is satisfied with Plan of action and management.    Corrin Parker, M.D.  [facility]  Medical Director [place] Director [facility] Cardio-Pulmonary Department

## 2015-02-06 NOTE — Op Note (Signed)
Electromagnetic Navigation Bronchoscopy: Indication: RUL lung mass  Preoperative Diagnosis:lung mass Post Procedure Diagnosis: lung mass Consent: verbal/written Risks and benefits explained in detail including risk of infection, bleeding, respiratory failure and death.   Hand washing performed prior to starting the procedure.   Type of Anesthesia: see Anesthesiology records .   Procedure Performed:  Virtual Bronchoscopy with Multi-planar Image analysis, 3-D reconstruction of coronal, sagittal and multi-planar images for the purposes of planning real-time bronchoscopy using the iLogic Electromagnetic Navigation Bronchoscopy System (superDimension)..   Description of Procedure: After obtaining informed consent from the patient, the above sedative and anesthetic measures were carried out, flexible fiberoptic bronchoscope was inserted via an oral bite block. Posterior pharynx was clear. The 2 vocal cords were easily traversed after application of local anesthetic.  The virtual camera was then placed into the central portion of the trachea. The trachea itself was inspected.  The main carina, right and left midstem bronchus and all the segmental and subsegmental airways by virtual bronchoscopy were brieftly inspected.  The camera was directed to standard registration points at the following centers: main carina, right upper lobe bronchus, right lower lobe bronchus, right middle lobe bronchus, left upper lobe bronchus, and the left lower lobe bronchus. This data was transferred to the i-Logic ENB system for real-time bronchoscopy.   Specimans Obtained:  Transbronchial Fine Needle Aspirations 21G times:3  ENDOBRONCHIAL Forceps Biopsy times:6   Fluoroscopy:  Fluoroscopy was utilized during the course of this procedure to assure that biopsies were taken in a safe manner under fluoroscopic guidance with no spot films required.   Complications:none  Estimated Blood Loss: none  Monitoring:  The  patient was monitored with continuous oximetry and received supplemental nasal cannula oxygen throughout the procedure. In addition, serial blood pressure measurements and continuous electrocardiography showed these physiologic parameters to remain tolerable throughout the procedure.   Assessment and Plan/Additional Comments:follow up pathology reports    Corrin Parker, M.D.  [facility]  Medical Director [place] Director [facility] Cardio-Pulmonary Department

## 2015-02-06 NOTE — Anesthesia Preprocedure Evaluation (Signed)
Anesthesia Evaluation  Patient identified by MRN, date of birth, ID band Patient awake    Reviewed: Allergy & Precautions, H&P , NPO status , Patient's Chart, lab work & pertinent test results, reviewed documented beta blocker date and time   History of Anesthesia Complications Negative for: history of anesthetic complications  Airway Mallampati: I  TM Distance: >3 FB Neck ROM: full    Dental no notable dental hx. (+) Partial Upper, Partial Lower, Loose Bottom far right tooth is loose:   Pulmonary shortness of breath, neg sleep apnea, COPD COPD inhaler, neg recent URI, former smoker,  breath sounds clear to auscultation  Pulmonary exam normal       Cardiovascular Exercise Tolerance: Good negative cardio ROS Normal cardiovascular examRhythm:regular Rate:Normal     Neuro/Psych Dementia negative psych ROS   GI/Hepatic negative GI ROS, Neg liver ROS,   Endo/Other  negative endocrine ROS  Renal/GU negative Renal ROS  negative genitourinary   Musculoskeletal   Abdominal   Peds  Hematology negative hematology ROS (+)   Anesthesia Other Findings Past Medical History:   Cataract                                                     COPD (chronic obstructive pulmonary disease)                 Shortness of breath dyspnea                                  HOH (hard of hearing)                                        Reproductive/Obstetrics negative OB ROS                             Anesthesia Physical Anesthesia Plan  ASA: II  Anesthesia Plan: General   Post-op Pain Management:    Induction:   Airway Management Planned:   Additional Equipment:   Intra-op Plan:   Post-operative Plan:   Informed Consent: I have reviewed the patients History and Physical, chart, labs and discussed the procedure including the risks, benefits and alternatives for the proposed anesthesia with the patient or  authorized representative who has indicated his/her understanding and acceptance.   Dental Advisory Given  Plan Discussed with: Anesthesiologist, CRNA and Surgeon  Anesthesia Plan Comments:         Anesthesia Quick Evaluation

## 2015-02-06 NOTE — Interval H&P Note (Signed)
History and Physical Interval Note:  02/06/2015 12:45 PM  Brian Coleman  has presented today for surgery, with the diagnosis of RIGHT UPPER LUNG MASS  The various methods of treatment have been discussed with the patient and family. After consideration of risks, benefits and other options for treatment, the patient has consented to  Procedure(s): ELECTROMAGNETIC NAVIGATION BRONCHOSCOPY (N/A) as a surgical intervention .  The patient's history has been reviewed, patient examined, no change in status, stable for surgery.  I have reviewed the patient's chart and labs.  Questions were answered to the patient's satisfaction.     Flora Lipps

## 2015-02-06 NOTE — Anesthesia Postprocedure Evaluation (Signed)
  Anesthesia Post-op Note  Patient: Brian Coleman  Procedure(s) Performed: Procedure(s): ELECTROMAGNETIC NAVIGATION BRONCHOSCOPY (N/A)  Anesthesia type:General  Patient location: PACU  Post pain: Pain level controlled  Post assessment: Post-op Vital signs reviewed, Patient's Cardiovascular Status Stable, Respiratory Function Stable, Patent Airway and No signs of Nausea or vomiting  Post vital signs: Reviewed and stable  Last Vitals:  Filed Vitals:   02/06/15 1415  BP: 119/61  Pulse: 65  Temp: 36.2 C  Resp: 18    Level of consciousness: awake, alert  and patient cooperative  Complications: No apparent anesthesia complications

## 2015-02-06 NOTE — Discharge Instructions (Addendum)
Flexible Bronchoscopy, Care After Refer to this sheet in the next few weeks. These instructions provide you with information on caring for yourself after your procedure. Your health care provider may also give you more specific instructions. Your treatment has been planned according to current medical practices, but problems sometimes occur. Call your health care provider if you have any problems or questions after your procedure.  WHAT TO EXPECT AFTER THE PROCEDURE It is normal to have the following symptoms for 24-48 hours after the procedure:   Increased cough.  Low-grade fever.  Sore throat or hoarse voice.  Small streaks of blood in your thick spit (sputum) if tissue samples were taken (biopsy). HOME CARE INSTRUCTIONS   Do not eat or drink anything for 2 hours after your procedure. Your nose and throat were numbed by medicine. If you try to eat or drink before the medicine wears off, food or drink could go into your lungs or you could burn yourself. After the numbness is gone and your cough and gag reflexes have returned, you may eat soft food and drink liquids slowly.   The day after the procedure, you can go back to your normal diet.   You may resume normal activities.   Keep all follow-up visits as directed by your health care provider. It is important to keep all your appointments, especially if tissue samples were taken for testing (biopsy). SEEK IMMEDIATE MEDICAL CARE IF:   You have increasing shortness of breath.   You become light-headed or faint.   You have chest pain.   You have any new concerning symptoms.  You cough up more than a small amount of blood.  The amount of blood you cough up increases. MAKE SURE YOU:  Understand these instructions.  Will watch your condition.  Will get help right away if you are not doing well or get worse. Document Released: 12/11/2004 Document Revised: 10/08/2013 Document Reviewed: 01/26/2013 [facility] Patient Information  2015 [place], [place]. This information is not intended to replace advice given to you by your health care provider. Make sure you discuss any questions you have with your health care provider.    AMBULATORY SURGERY  DISCHARGE INSTRUCTIONS   1) The drugs that you were given will stay in your system until tomorrow so for the next 24 hours you should not:  A) Drive an automobile B) Make any legal decisions C) Drink any alcoholic beverage   2) You may resume regular meals tomorrow.  Today it is better to start with liquids and gradually work up to solid foods.  You may eat anything you prefer, but it is better to start with liquids, then soup and crackers, and gradually work up to solid foods.   3) Please notify your doctor immediately if you have any unusual bleeding, trouble breathing, redness and pain at the surgery site, drainage, fever, or pain not relieved by medication. 4)   5) Your post-operative visit with Dr.                                     is: Date:                        Time:    Please call to schedule your post-operative visit.  6) Additional Instructions: 7)

## 2015-02-06 NOTE — Transfer of Care (Signed)
Immediate Anesthesia Transfer of Care Note  Patient: Brian Coleman  Procedure(s) Performed: Procedure(s): ELECTROMAGNETIC NAVIGATION BRONCHOSCOPY (N/A)  Patient Location: PACU  Anesthesia Type:General  Level of Consciousness: awake  Airway & Oxygen Therapy: Patient Spontanous Breathing and Patient connected to face mask oxygen  Post-op Assessment: Report given to RN  Post vital signs: Reviewed and stable  Last Vitals:  Filed Vitals:   02/06/15 1415  BP: 119/61  Pulse: 68  Temp: 97.2 F  Resp: 16    Complications: No apparent anesthesia complications

## 2015-02-07 LAB — SURGICAL PATHOLOGY

## 2015-02-07 LAB — CYTOLOGY - NON PAP

## 2015-02-10 ENCOUNTER — Encounter: Payer: Self-pay | Admitting: Oncology

## 2015-02-12 ENCOUNTER — Inpatient Hospital Stay: Payer: Medicare Other | Attending: Oncology | Admitting: Oncology

## 2015-02-12 ENCOUNTER — Telehealth: Payer: Self-pay | Admitting: *Deleted

## 2015-02-12 VITALS — BP 125/72 | HR 92 | Temp 96.8°F | Resp 20

## 2015-02-12 DIAGNOSIS — J449 Chronic obstructive pulmonary disease, unspecified: Secondary | ICD-10-CM

## 2015-02-12 DIAGNOSIS — F17210 Nicotine dependence, cigarettes, uncomplicated: Secondary | ICD-10-CM

## 2015-02-12 DIAGNOSIS — Z7982 Long term (current) use of aspirin: Secondary | ICD-10-CM | POA: Diagnosis not present

## 2015-02-12 DIAGNOSIS — F0390 Unspecified dementia without behavioral disturbance: Secondary | ICD-10-CM

## 2015-02-12 DIAGNOSIS — Z79899 Other long term (current) drug therapy: Secondary | ICD-10-CM

## 2015-02-12 DIAGNOSIS — H9190 Unspecified hearing loss, unspecified ear: Secondary | ICD-10-CM

## 2015-02-12 DIAGNOSIS — R0602 Shortness of breath: Secondary | ICD-10-CM

## 2015-02-12 DIAGNOSIS — C3411 Malignant neoplasm of upper lobe, right bronchus or lung: Secondary | ICD-10-CM | POA: Diagnosis not present

## 2015-02-12 DIAGNOSIS — R634 Abnormal weight loss: Secondary | ICD-10-CM | POA: Diagnosis not present

## 2015-02-12 DIAGNOSIS — R079 Chest pain, unspecified: Secondary | ICD-10-CM

## 2015-02-12 DIAGNOSIS — C3412 Malignant neoplasm of upper lobe, left bronchus or lung: Secondary | ICD-10-CM

## 2015-02-12 NOTE — Telephone Encounter (Signed)
advair [facility] please

## 2015-02-12 NOTE — Telephone Encounter (Signed)
Received PA request for Dulera 200 that you sent to pharmacy. Insurance wants alternatives tried first.  Alternatives are Advair HFA, Breo Ellipta, Advair Diskus or Symbicort. Pt needs to try and fail 2. Please advise.

## 2015-02-12 NOTE — Progress Notes (Signed)
Patient here today for biopsy results.

## 2015-02-14 ENCOUNTER — Encounter: Payer: Self-pay | Admitting: Internal Medicine

## 2015-02-14 DIAGNOSIS — C3410 Malignant neoplasm of upper lobe, unspecified bronchus or lung: Secondary | ICD-10-CM | POA: Insufficient documentation

## 2015-02-14 NOTE — Progress Notes (Signed)
[place]  Telephone:(336) [phone] Fax:(336) [phone]  ID: Brian Coleman OB: 31-May-1934  MR#: [id number]  DEY#:[id number]  Patient Care Team: Albina Billet, MD as PCP - General (Internal Medicine)  CHIEF COMPLAINT:  Chief Complaint  Patient presents with  . Lung Cancer    INTERVAL HISTORY: Patient returns to clinic today to discuss his pathology results and treatment planning. Much of the history is again given by his wife and daughters. He currently feels well and is asymptomatic. There is report of some weight loss, but unclear how much. He denies any fevers or illnesses.  There are no neurologic complaints. He denies any nausea, vomiting, constipation, or diarrhea. He has no urinary complaints. Patient  offers no further specific complaints today.  REVIEW OF SYSTEMS:   Review of Systems  Constitutional: Positive for weight loss. Negative for fever.  HENT: Negative.   Respiratory: Positive for shortness of breath.   Cardiovascular: Positive for chest pain.  Gastrointestinal: Negative.   Neurological: Negative.   Psychiatric/Behavioral: Positive for memory loss.    As per HPI. Otherwise, a complete review of systems is negatve.  PAST MEDICAL HISTORY: Past Medical History  Diagnosis Date  . Cataract   . COPD (chronic obstructive pulmonary disease)   . Shortness of breath dyspnea   . HOH (hard of hearing)     PAST SURGICAL HISTORY: Past Surgical History  Procedure Laterality Date  . Knee surgery    . Back surgery    . Cataract extraction    . Shoulder surgery    . Electromagnetic navigation brochoscopy N/A 02/06/2015    Procedure: ELECTROMAGNETIC NAVIGATION BRONCHOSCOPY;  Surgeon: Flora Lipps, MD;  Location: [facility] ORS;  Service: Cardiopulmonary;  Laterality: N/A;    FAMILY HISTORY: Reviewed and unchanged. No reported history of malignancy or chronic disease.     ADVANCED DIRECTIVES:    HEALTH MAINTENANCE: Social History  Substance Use Topics    . Smoking status: Current Every Day Smoker -- 0.75 packs/day for 60 years    Types: Cigarettes  . Smokeless tobacco: Never Used  . Alcohol Use: No     Colonoscopy:  PAP:  Bone density:  Lipid panel:  Allergies  Allergen Reactions  . Codeine     hallucinations    Current Outpatient Prescriptions  Medication Sig Dispense Refill  . albuterol (PROVENTIL HFA;VENTOLIN HFA) 108 (90 BASE) MCG/ACT inhaler Inhale 2 puffs into the lungs every 6 (six) hours as needed for wheezing or shortness of breath. 1 Inhaler 2  . aspirin 81 MG tablet Take 81 mg by mouth daily.    Marland Kitchen lactose free nutrition (BOOST) LIQD Take 237 mLs by mouth daily.    . mometasone-formoterol (DULERA) 200-5 MCG/ACT AERO Inhale 2 puffs into the lungs 2 (two) times daily. 1 Inhaler 12  . tiotropium (SPIRIVA HANDIHALER) 18 MCG inhalation capsule Place 1 capsule (18 mcg total) into inhaler and inhale daily. 30 capsule 2   No current facility-administered medications for this visit.    OBJECTIVE: Filed Vitals:   02/12/15 1524  BP: 125/72  Pulse: 92  Temp: 96.8 F (36 C)  Resp: 20     There is no weight on file to calculate BMI.    ECOG FS:0 - Asymptomatic  General: Well-developed, well-nourished, no acute distress. Eyes: Pink conjunctiva, anicteric sclera. Lungs: Clear to auscultation bilaterally. Heart: Regular rate and rhythm. No rubs, murmurs, or gallops. Abdomen: Soft, nontender, nondistended. No organomegaly noted, normoactive bowel sounds. Musculoskeletal: No edema, cyanosis, or clubbing. Neuro: Alert,  intermittently confused. Cranial nerves grossly intact. Skin: No rashes or petechiae noted. Psych: Normal affect.   LAB RESULTS:  No results found for: NA, K, CL, CO2, GLUCOSE, BUN, CREATININE, CALCIUM, PROT, ALBUMIN, AST, ALT, ALKPHOS, BILITOT, GFRNONAA, GFRAA  No results found for: WBC, NEUTROABS, HGB, HCT, MCV, PLT   STUDIES: Ct Chest W Contrast  01/23/2015   CLINICAL DATA:  Irregular right upper  lobe mass on chest radiographs, concerning for malignancy. No history of malignancy. Patient fell 2 weeks ago. Initial encounter.  EXAM: CT CHEST WITH CONTRAST  TECHNIQUE: Multidetector CT imaging of the chest was performed during intravenous contrast administration.  CONTRAST:  64m OMNIPAQUE IOHEXOL 300 MG/ML  SOLN  COMPARISON:  Chest radiographs 01/09/2015.  FINDINGS: Mediastinum/Nodes: There are no enlarged mediastinal, hilar or axillary lymph nodes. There are probable dependent secretions within the trachea and right mainstem bronchus. The thyroid gland and esophagus demonstrate no significant findings. The heart size is normal. There is no pericardial effusion. There is atherosclerosis of the aorta, great vessels and coronary arteries.  Lungs/Pleura: There is no pleural effusion. Moderate diffuse changes of centrilobular and paraseptal emphysema. As demonstrated radiographically, there is an irregular right upper lobe mass which measures 3.3 x 4.2 x 2.9 cm, consistent with bronchogenic carcinoma. No other suspicious pulmonary nodules. There is mild central airway thickening.  Upper abdomen: Multiple well-circumscribed water density hepatic lesions are consistent with cysts. Some of these are too small to optimally characterize, but no suspicious lesions demonstrated. No evidence of adrenal mass.  Musculoskeletal/Chest wall: There is no chest wall mass or suspicious osseous finding. Mild thoracic spine degenerative changes are present.  IMPRESSION: 1. Spiculated right upper lobe mass consistent with bronchogenic carcinoma. No evidence of metastatic disease. 2. Moderate underlying emphysema. 3. Mild atherosclerosis. 4. Probable hepatic cysts.   Electronically Signed   By: WRichardean SaleM.D.   On: 01/23/2015 14:03   Dg C-arm 1-60 Min-no Report  02/06/2015   CLINICAL DATA: procedure   C-ARM 1-60 MINUTES  Fluoroscopy was utilized by the requesting physician.  No radiographic  interpretation.     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   PLAN:    1. Lung cancer: Given patient's extensive eczema seen on CT scan and his underlying dementia, he is not a surgical candidate. Patient's family also is not interested in chemotherapy. Given the size and location of his tumor, he may benefit from XRT and a referral was given to radiation oncology for further evaluation. Prior to this appointment, patient will have a PET scan and MRI to complete his staging workup. Follow-up 1-2 weeks after the conclusion of his XRT for further evaluation and future diagnostic planning. If patient is found to have metastatic disease, he will return to clinic sooner to discuss treatment options.   Approximately 30 minutes was spent in discussion and consultation.  Patient expressed understanding and was in agreement with this plan. He also understands that He can call clinic at any time with any questions, concerns, or complaints.    TLloyd Huger MD   02/14/2015 3:00 PM

## 2015-02-17 MED ORDER — FLUTICASONE-SALMETEROL 230-21 MCG/ACT IN AERO: 2.0000 | INHALATION_SPRAY | Freq: Two times a day (BID) | RESPIRATORY_TRACT | Status: AC

## 2015-02-17 NOTE — Telephone Encounter (Signed)
Advair HFA 230 sent to pharmacy. LMOM informing pt. Nothing further needed.

## 2015-02-18 ENCOUNTER — Ambulatory Visit
Admission: RE | Admit: 2015-02-18 | Discharge: 2015-02-18 | Disposition: A | Discharge status: Home / Self Care | Payer: Medicare Other | Source: Ambulatory Visit | Attending: Oncology | Admitting: Oncology

## 2015-02-18 ENCOUNTER — Ambulatory Visit: Payer: Medicare Other

## 2015-02-18 DIAGNOSIS — C3411 Malignant neoplasm of upper lobe, right bronchus or lung: Secondary | ICD-10-CM | POA: Diagnosis present

## 2015-02-18 DIAGNOSIS — I700 Atherosclerosis of aorta: Secondary | ICD-10-CM | POA: Diagnosis not present

## 2015-02-18 DIAGNOSIS — J439 Emphysema, unspecified: Secondary | ICD-10-CM | POA: Diagnosis not present

## 2015-02-18 DIAGNOSIS — I77811 Abdominal aortic ectasia: Secondary | ICD-10-CM | POA: Insufficient documentation

## 2015-02-18 LAB — GLUCOSE, CAPILLARY: GLUCOSE-CAPILLARY: 107 mg/dL — AB (ref 65–99)

## 2015-02-18 MED ORDER — FLUDEOXYGLUCOSE F - 18 (FDG) INJECTION
12.0000 | Freq: Once | INTRAVENOUS | Status: DC | PRN
  Administered 2015-02-18: 11.98 via INTRAVENOUS
  Filled 2015-02-18: qty 12

## 2015-02-21 ENCOUNTER — Ambulatory Visit: Payer: Medicare Other

## 2015-02-24 ENCOUNTER — Ambulatory Visit: Payer: Medicare Other

## 2015-02-24 ENCOUNTER — Ambulatory Visit
Admission: RE | Admit: 2015-02-24 | Discharge: 2015-02-24 | Disposition: A | Discharge status: Home / Self Care | Payer: Medicare Other | Source: Ambulatory Visit | Attending: Oncology | Admitting: Oncology

## 2015-02-24 ENCOUNTER — Institutional Professional Consult (permissible substitution): Payer: Medicare Other | Admitting: Radiation Oncology

## 2015-02-24 DIAGNOSIS — C3411 Malignant neoplasm of upper lobe, right bronchus or lung: Secondary | ICD-10-CM | POA: Insufficient documentation

## 2015-02-24 DIAGNOSIS — G319 Degenerative disease of nervous system, unspecified: Secondary | ICD-10-CM | POA: Diagnosis not present

## 2015-02-24 DIAGNOSIS — I739 Peripheral vascular disease, unspecified: Secondary | ICD-10-CM | POA: Diagnosis not present

## 2015-02-24 MED ORDER — GADOBENATE DIMEGLUMINE 529 MG/ML IV SOLN
15.0000 mL | Freq: Once | INTRAVENOUS | Status: AC | PRN
  Administered 2015-02-24: 15 mL via INTRAVENOUS

## 2015-02-26 ENCOUNTER — Ambulatory Visit
Admission: RE | Admit: 2015-02-26 | Discharge: 2015-02-26 | Disposition: A | Discharge status: Home / Self Care | Payer: Medicare Other | Source: Ambulatory Visit | Attending: Radiation Oncology | Admitting: Radiation Oncology

## 2015-02-26 ENCOUNTER — Encounter: Payer: Self-pay | Admitting: Radiation Oncology

## 2015-02-26 ENCOUNTER — Other Ambulatory Visit: Payer: Self-pay | Admitting: *Deleted

## 2015-02-26 VITALS — BP 127/77 | HR 88 | Temp 95.9°F | Resp 20 | Wt 122.6 lb

## 2015-02-26 DIAGNOSIS — F0390 Unspecified dementia without behavioral disturbance: Secondary | ICD-10-CM | POA: Insufficient documentation

## 2015-02-26 DIAGNOSIS — Z79899 Other long term (current) drug therapy: Secondary | ICD-10-CM | POA: Insufficient documentation

## 2015-02-26 DIAGNOSIS — F17210 Nicotine dependence, cigarettes, uncomplicated: Secondary | ICD-10-CM | POA: Insufficient documentation

## 2015-02-26 DIAGNOSIS — Z510 Encounter for antineoplastic radiation therapy: Secondary | ICD-10-CM | POA: Insufficient documentation

## 2015-02-26 DIAGNOSIS — C3411 Malignant neoplasm of upper lobe, right bronchus or lung: Secondary | ICD-10-CM | POA: Insufficient documentation

## 2015-02-26 DIAGNOSIS — C7931 Secondary malignant neoplasm of brain: Secondary | ICD-10-CM

## 2015-02-26 DIAGNOSIS — Z7951 Long term (current) use of inhaled steroids: Secondary | ICD-10-CM | POA: Insufficient documentation

## 2015-02-26 DIAGNOSIS — J449 Chronic obstructive pulmonary disease, unspecified: Secondary | ICD-10-CM | POA: Insufficient documentation

## 2015-02-26 DIAGNOSIS — Z7982 Long term (current) use of aspirin: Secondary | ICD-10-CM | POA: Insufficient documentation

## 2015-02-26 MED ORDER — ESOMEPRAZOLE MAGNESIUM 40 MG PO CPDR: 40.0000 mg | DELAYED_RELEASE_CAPSULE | Freq: Every day | ORAL | Status: AC

## 2015-02-26 MED ORDER — DEXAMETHASONE 4 MG PO TABS: 4.0000 mg | ORAL_TABLET | Freq: Every day | ORAL | Status: AC

## 2015-02-26 NOTE — Consult Note (Signed)
Except an outstanding is perfect of Radiation Oncology NEW PATIENT EVALUATION  Name: Brian Coleman  MRN: [id number]  Date:   02/26/2015     DOB: Nov 09, 1933   This 81 y.o. male patient presents to the clinic for initial evaluation of stage IV lung cancer with brain metastasis.  REFERRING PHYSICIAN: Albina Billet, MD  CHIEF COMPLAINT:  Chief Complaint  Patient presents with  . Lung Cancer    Pt is here for initial consultation of lung cancer with brain metastasis.      DIAGNOSIS: The primary encounter diagnosis was Malignant neoplasm of upper lobe of right lung. A diagnosis of Brain metastasis was also pertinent to this visit.   PREVIOUS INVESTIGATIONS:  MRI scan of brain and PET CT scan and CT scans all reviewed Pathology report reviewed Clinical notes reviewed  HPI: Patient is a 81 year old male with early dementia who presented with a cough found to have an abnormal chest x-ray confirmed on CT scan to have a right upper lobe spiculated mass consistent with malignancy. He underwent virtual bronchoscopy and biopsy was positive for non-small cell lung cancer favoring squamous cell carcinoma. PET CT scan was confirmed showing mass in the right upper lobe intensely hypermetabolic with a satellite 9 mm lesion adjacent to the primary tumor. No evidence of hypermetabolic hilar or mediastinal nodes were noted. There is also tiny left upper lobe nodule measuring 5 mm not significantly FDG positive. He underwent MRI of the brain showing a 2.3 x 2.3 cm enhancing mass in the medial aspect of the posterior left frontal parietal lobe with surrounding vasogenic edema consistent with solitary brain metastasis. Patient has been seen by medical oncology and family was not interested in systemic chemotherapy. He is seen today for consideration of palliative radiation therapy. He is doing fairly well he is able to answer my questions in a slow slightly confused manner. He does say he has occasional slight  headache. He has not been started on steroidal at this time. He does have a slight productive cough no hemoptysis.  PLANNED TREATMENT REGIMEN: Whole brain radiation therapy with boost plus palliative radiation therapy to chest  PAST MEDICAL HISTORY:  has a past medical history of Cataract; COPD (chronic obstructive pulmonary disease); Shortness of breath dyspnea; and HOH (hard of hearing).    PAST SURGICAL HISTORY:  Past Surgical History  Procedure Laterality Date  . Knee surgery    . Back surgery    . Cataract extraction    . Shoulder surgery    . Electromagnetic navigation brochoscopy N/A 02/06/2015    Procedure: ELECTROMAGNETIC NAVIGATION BRONCHOSCOPY;  Surgeon: Flora Lipps, MD;  Location: [facility] ORS;  Service: Cardiopulmonary;  Laterality: N/A;    FAMILY HISTORY: family history is not on file.  SOCIAL HISTORY:  reports that he has been smoking Cigarettes.  He has a 45 pack-year smoking history. He has never used smokeless tobacco. He reports that he does not drink alcohol or use illicit drugs.  ALLERGIES: Codeine  MEDICATIONS:  Current Outpatient Prescriptions  Medication Sig Dispense Refill  . albuterol (PROVENTIL HFA;VENTOLIN HFA) 108 (90 BASE) MCG/ACT inhaler Inhale 2 puffs into the lungs every 6 (six) hours as needed for wheezing or shortness of breath. 1 Inhaler 2  . aspirin 81 MG tablet Take 81 mg by mouth daily.    . fluticasone-salmeterol (ADVAIR HFA) 230-21 MCG/ACT inhaler Inhale 2 puffs into the lungs 2 (two) times daily. 1 Inhaler 12  . lactose free nutrition (BOOST) LIQD Take 237 mLs by mouth  daily.    . mometasone-formoterol (DULERA) 200-5 MCG/ACT AERO Inhale 2 puffs into the lungs 2 (two) times daily. 1 Inhaler 12  . tiotropium (SPIRIVA HANDIHALER) 18 MCG inhalation capsule Place 1 capsule (18 mcg total) into inhaler and inhale daily. 30 capsule 2  . dexamethasone (DECADRON) 4 MG tablet Take 1 tablet (4 mg total) by mouth daily. 30 tablet 0  . esomeprazole (NEXIUM) 40  MG capsule Take 1 capsule (40 mg total) by mouth daily at 12 noon. 30 capsule 1   No current facility-administered medications for this encounter.    ECOG PERFORMANCE STATUS:  1 - Symptomatic but completely ambulatory  REVIEW OF SYSTEMS: Patient is extremely poor historian review of systems obtained from family  Patient denies any weight loss, fatigue, weakness, fever, chills or night sweats. Patient denies any loss of vision, blurred vision. Patient denies any ringing  of the ears or hearing loss. No irregular heartbeat. Patient denies heart murmur or history of fainting. Patient denies any chest pain or pain radiating to her upper extremities. Patient denies any shortness of breath, difficulty breathing at night, cough or hemoptysis. Patient denies any swelling in the lower legs. Patient denies any nausea vomiting, vomiting of blood, or coffee ground material in the vomitus. Patient denies any stomach pain. Patient states has had normal bowel movements no significant constipation or diarrhea. Patient denies any dysuria, hematuria or significant nocturia. Patient denies any problems walking, swelling in the joints or loss of balance. Patient denies any skin changes, loss of hair or loss of weight. Patient denies any excessive worrying or anxiety or significant depression. Patient denies any problems with insomnia. Patient denies excessive thirst, polyuria, polydipsia. Patient denies any swollen glands, patient denies easy bruising or easy bleeding. Patient denies any recent infections, allergies or URI. Patient "s visual fields have not changed significantly in recent time.    PHYSICAL EXAM: BP 127/77 mmHg  Pulse 88  Temp(Src) 95.9 F (35.5 C)  Resp 20  Wt 122 lb 9.2 oz (55.6 kg) Well-developed male. He does have evidence of anorexia. Cranial nerves II through XII are grossly intact. Motor sensory and DTR levels are equal and symmetric in the upper lower extremities. Proprioception is  intact. Well-developed well-nourished patient in NAD. HEENT reveals PERLA, EOMI, discs not visualized.  Oral cavity is clear. No oral mucosal lesions are identified. Neck is clear without evidence of cervical or supraclavicular adenopathy. Lungs are clear to A&P. Cardiac examination is essentially unremarkable with regular rate and rhythm without murmur rub or thrill. Abdomen is benign with no organomegaly or masses noted. Motor sensory and DTR levels are equal and symmetric in the upper and lower extremities. Cranial nerves II through XII are grossly intact. Proprioception is intact. No peripheral adenopathy or edema is identified. No motor or sensory levels are noted. Crude visual fields are within normal range.   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 Would also boost using I am RT his solitary brain metastasis another 2000 cGy in 4 fractions. Also treat his lung lesion to 6000 cGy in 10 fractions using I am RT treatment planning and delivery. I believe the size of the  lesion with a satellite lesion, discs takes his pastor 4 cm limit for SB RT. Risks and benefits of treatment including hair loss fatigue alteration of blood counts cough possible dysphasia secondary to radiation esophagitis and possible cognitive decline all were discussed in detail with the patient and his family. They all seem to comprehend my treatment plan well. I have set up and ordered CT simulation for tomorrow. Have started the patient on 4 mg of Decadron in the mornings. I'm also prescribing Nexium to cover his gastric mucosa.  I would like to take this opportunity for allowing me to participate in  the care of your patient.Armstead Peaks., MD

## 2015-02-27 ENCOUNTER — Ambulatory Visit
Admission: RE | Admit: 2015-02-27 | Discharge: 2015-02-27 | Disposition: A | Discharge status: Home / Self Care | Payer: Medicare Other | Source: Ambulatory Visit | Attending: Radiation Oncology | Admitting: Radiation Oncology

## 2015-02-27 DIAGNOSIS — C7931 Secondary malignant neoplasm of brain: Secondary | ICD-10-CM | POA: Diagnosis not present

## 2015-02-27 DIAGNOSIS — Z79899 Other long term (current) drug therapy: Secondary | ICD-10-CM | POA: Diagnosis not present

## 2015-02-27 DIAGNOSIS — Z510 Encounter for antineoplastic radiation therapy: Secondary | ICD-10-CM | POA: Diagnosis present

## 2015-02-27 DIAGNOSIS — C3411 Malignant neoplasm of upper lobe, right bronchus or lung: Secondary | ICD-10-CM | POA: Diagnosis not present

## 2015-02-27 DIAGNOSIS — F0390 Unspecified dementia without behavioral disturbance: Secondary | ICD-10-CM | POA: Diagnosis not present

## 2015-02-27 DIAGNOSIS — Z7982 Long term (current) use of aspirin: Secondary | ICD-10-CM | POA: Diagnosis not present

## 2015-02-27 DIAGNOSIS — Z7951 Long term (current) use of inhaled steroids: Secondary | ICD-10-CM | POA: Diagnosis not present

## 2015-02-27 DIAGNOSIS — F17210 Nicotine dependence, cigarettes, uncomplicated: Secondary | ICD-10-CM | POA: Diagnosis not present

## 2015-02-27 DIAGNOSIS — J449 Chronic obstructive pulmonary disease, unspecified: Secondary | ICD-10-CM | POA: Diagnosis not present

## 2015-02-28 ENCOUNTER — Ambulatory Visit: Payer: Medicare Other

## 2015-02-28 DIAGNOSIS — Z510 Encounter for antineoplastic radiation therapy: Secondary | ICD-10-CM | POA: Diagnosis not present

## 2015-03-03 DIAGNOSIS — Z510 Encounter for antineoplastic radiation therapy: Secondary | ICD-10-CM | POA: Diagnosis not present

## 2015-03-04 ENCOUNTER — Ambulatory Visit
Admission: RE | Admit: 2015-03-04 | Discharge: 2015-03-04 | Disposition: A | Discharge status: Home / Self Care | Payer: Medicare Other | Source: Ambulatory Visit | Attending: Radiation Oncology | Admitting: Radiation Oncology

## 2015-03-04 ENCOUNTER — Ambulatory Visit: Payer: Medicare Other

## 2015-03-05 ENCOUNTER — Ambulatory Visit
Admission: RE | Admit: 2015-03-05 | Discharge: 2015-03-05 | Disposition: A | Discharge status: Home / Self Care | Payer: Medicare Other | Source: Ambulatory Visit | Attending: Radiation Oncology | Admitting: Radiation Oncology

## 2015-03-05 DIAGNOSIS — Z510 Encounter for antineoplastic radiation therapy: Secondary | ICD-10-CM | POA: Diagnosis not present

## 2015-03-06 ENCOUNTER — Ambulatory Visit: Payer: Medicare Other

## 2015-03-07 ENCOUNTER — Ambulatory Visit: Payer: Medicare Other

## 2015-03-10 ENCOUNTER — Ambulatory Visit
Admission: RE | Admit: 2015-03-10 | Discharge: 2015-03-10 | Disposition: A | Discharge status: Home / Self Care | Payer: Medicare Other | Source: Ambulatory Visit | Attending: Radiation Oncology | Admitting: Radiation Oncology

## 2015-03-10 DIAGNOSIS — Z510 Encounter for antineoplastic radiation therapy: Secondary | ICD-10-CM | POA: Diagnosis not present

## 2015-03-11 ENCOUNTER — Ambulatory Visit
Admission: RE | Admit: 2015-03-11 | Discharge: 2015-03-11 | Disposition: A | Discharge status: Home / Self Care | Payer: Medicare Other | Source: Ambulatory Visit | Attending: Radiation Oncology | Admitting: Radiation Oncology

## 2015-03-11 DIAGNOSIS — Z510 Encounter for antineoplastic radiation therapy: Secondary | ICD-10-CM | POA: Diagnosis not present

## 2015-03-12 ENCOUNTER — Ambulatory Visit
Admission: RE | Admit: 2015-03-12 | Discharge: 2015-03-12 | Disposition: A | Discharge status: Home / Self Care | Payer: Medicare Other | Source: Ambulatory Visit | Attending: Radiation Oncology | Admitting: Radiation Oncology

## 2015-03-12 DIAGNOSIS — Z510 Encounter for antineoplastic radiation therapy: Secondary | ICD-10-CM | POA: Diagnosis not present

## 2015-03-13 ENCOUNTER — Ambulatory Visit
Admission: RE | Admit: 2015-03-13 | Discharge: 2015-03-13 | Disposition: A | Discharge status: Home / Self Care | Payer: Medicare Other | Source: Ambulatory Visit | Attending: Radiation Oncology | Admitting: Radiation Oncology

## 2015-03-13 DIAGNOSIS — Z510 Encounter for antineoplastic radiation therapy: Secondary | ICD-10-CM | POA: Diagnosis not present

## 2015-03-14 ENCOUNTER — Ambulatory Visit
Admission: RE | Admit: 2015-03-14 | Discharge: 2015-03-14 | Disposition: A | Discharge status: Home / Self Care | Payer: Medicare Other | Source: Ambulatory Visit | Attending: Radiation Oncology | Admitting: Radiation Oncology

## 2015-03-14 DIAGNOSIS — Z510 Encounter for antineoplastic radiation therapy: Secondary | ICD-10-CM | POA: Diagnosis not present

## 2015-03-17 ENCOUNTER — Ambulatory Visit
Admission: RE | Admit: 2015-03-17 | Discharge: 2015-03-17 | Disposition: A | Discharge status: Home / Self Care | Payer: Medicare Other | Source: Ambulatory Visit | Attending: Radiation Oncology | Admitting: Radiation Oncology

## 2015-03-17 DIAGNOSIS — Z510 Encounter for antineoplastic radiation therapy: Secondary | ICD-10-CM | POA: Diagnosis not present

## 2015-03-18 ENCOUNTER — Ambulatory Visit
Admission: RE | Admit: 2015-03-18 | Discharge: 2015-03-18 | Disposition: A | Discharge status: Home / Self Care | Payer: Medicare Other | Source: Ambulatory Visit | Attending: Radiation Oncology | Admitting: Radiation Oncology

## 2015-03-18 DIAGNOSIS — Z510 Encounter for antineoplastic radiation therapy: Secondary | ICD-10-CM | POA: Diagnosis not present

## 2015-03-19 ENCOUNTER — Ambulatory Visit
Admission: RE | Admit: 2015-03-19 | Discharge: 2015-03-19 | Disposition: A | Discharge status: Home / Self Care | Payer: Medicare Other | Source: Ambulatory Visit | Attending: Radiation Oncology | Admitting: Radiation Oncology

## 2015-03-19 ENCOUNTER — Ambulatory Visit: Payer: Medicare Other

## 2015-03-19 DIAGNOSIS — Z510 Encounter for antineoplastic radiation therapy: Secondary | ICD-10-CM | POA: Diagnosis not present

## 2015-03-20 ENCOUNTER — Ambulatory Visit
Admission: RE | Admit: 2015-03-20 | Discharge: 2015-03-20 | Disposition: A | Discharge status: Home / Self Care | Payer: Medicare Other | Source: Ambulatory Visit | Attending: Radiation Oncology | Admitting: Radiation Oncology

## 2015-03-20 DIAGNOSIS — Z510 Encounter for antineoplastic radiation therapy: Secondary | ICD-10-CM | POA: Diagnosis not present

## 2015-03-21 ENCOUNTER — Ambulatory Visit
Admission: RE | Admit: 2015-03-21 | Discharge: 2015-03-21 | Disposition: A | Discharge status: Home / Self Care | Payer: Medicare Other | Source: Ambulatory Visit | Attending: Radiation Oncology | Admitting: Radiation Oncology

## 2015-03-21 ENCOUNTER — Ambulatory Visit: Payer: Medicare Other

## 2015-03-21 DIAGNOSIS — Z510 Encounter for antineoplastic radiation therapy: Secondary | ICD-10-CM | POA: Diagnosis not present

## 2015-03-24 ENCOUNTER — Ambulatory Visit
Admission: RE | Admit: 2015-03-24 | Discharge: 2015-03-24 | Disposition: A | Discharge status: Home / Self Care | Payer: Medicare Other | Source: Ambulatory Visit | Attending: Radiation Oncology | Admitting: Radiation Oncology

## 2015-03-24 ENCOUNTER — Ambulatory Visit: Payer: Medicare Other

## 2015-03-24 DIAGNOSIS — Z510 Encounter for antineoplastic radiation therapy: Secondary | ICD-10-CM | POA: Diagnosis not present

## 2015-03-25 ENCOUNTER — Ambulatory Visit
Admission: RE | Admit: 2015-03-25 | Discharge: 2015-03-25 | Disposition: A | Discharge status: Home / Self Care | Payer: Medicare Other | Source: Ambulatory Visit | Attending: Radiation Oncology | Admitting: Radiation Oncology

## 2015-03-25 DIAGNOSIS — Z510 Encounter for antineoplastic radiation therapy: Secondary | ICD-10-CM | POA: Diagnosis not present

## 2015-03-26 ENCOUNTER — Ambulatory Visit
Admission: RE | Admit: 2015-03-26 | Discharge: 2015-03-26 | Disposition: A | Discharge status: Home / Self Care | Payer: Medicare Other | Source: Ambulatory Visit | Attending: Radiation Oncology | Admitting: Radiation Oncology

## 2015-03-26 DIAGNOSIS — Z510 Encounter for antineoplastic radiation therapy: Secondary | ICD-10-CM | POA: Diagnosis not present

## 2015-03-27 ENCOUNTER — Ambulatory Visit
Admission: RE | Admit: 2015-03-27 | Discharge: 2015-03-27 | Disposition: A | Discharge status: Home / Self Care | Payer: Medicare Other | Source: Ambulatory Visit | Attending: Radiation Oncology | Admitting: Radiation Oncology

## 2015-03-27 DIAGNOSIS — Z510 Encounter for antineoplastic radiation therapy: Secondary | ICD-10-CM | POA: Diagnosis not present

## 2015-03-28 ENCOUNTER — Other Ambulatory Visit: Payer: Self-pay | Admitting: Radiation Oncology

## 2015-03-28 ENCOUNTER — Ambulatory Visit
Admission: RE | Admit: 2015-03-28 | Discharge: 2015-03-28 | Disposition: A | Discharge status: Home / Self Care | Payer: Medicare Other | Source: Ambulatory Visit | Attending: Radiation Oncology | Admitting: Radiation Oncology

## 2015-03-28 DIAGNOSIS — Z510 Encounter for antineoplastic radiation therapy: Secondary | ICD-10-CM | POA: Diagnosis not present

## 2015-03-31 ENCOUNTER — Ambulatory Visit: Payer: Medicare Other | Admitting: Radiation Oncology

## 2015-03-31 ENCOUNTER — Other Ambulatory Visit: Payer: Self-pay | Admitting: *Deleted

## 2015-03-31 MED ORDER — DEXAMETHASONE 2 MG PO TABS: 2.0000 mg | ORAL_TABLET | Freq: Every day | ORAL | Status: AC

## 2015-04-01 ENCOUNTER — Ambulatory Visit: Payer: Medicare Other

## 2015-04-02 ENCOUNTER — Ambulatory Visit: Payer: Medicare Other

## 2015-04-03 ENCOUNTER — Ambulatory Visit: Payer: Medicare Other

## 2015-04-04 ENCOUNTER — Ambulatory Visit: Payer: Medicare Other

## 2015-04-07 ENCOUNTER — Ambulatory Visit: Payer: Medicare Other

## 2015-04-08 ENCOUNTER — Ambulatory Visit: Payer: Medicare Other

## 2015-04-09 ENCOUNTER — Ambulatory Visit: Payer: Medicare Other

## 2015-04-10 ENCOUNTER — Ambulatory Visit: Payer: Medicare Other

## 2015-04-10 ENCOUNTER — Ambulatory Visit
Admission: RE | Admit: 2015-04-10 | Discharge: 2015-04-10 | Disposition: A | Discharge status: Home / Self Care | Payer: Medicare Other | Source: Ambulatory Visit | Attending: Radiation Oncology | Admitting: Radiation Oncology

## 2015-04-10 ENCOUNTER — Encounter: Payer: Self-pay | Admitting: Radiation Oncology

## 2015-04-10 ENCOUNTER — Other Ambulatory Visit: Payer: Self-pay | Admitting: *Deleted

## 2015-04-10 VITALS — BP 105/76 | HR 101 | Temp 98.0°F | Resp 19 | Wt 106.0 lb

## 2015-04-10 DIAGNOSIS — C7931 Secondary malignant neoplasm of brain: Secondary | ICD-10-CM

## 2015-04-10 MED ORDER — MEGESTROL ACETATE 40 MG/ML PO SUSP: 400.0000 mg | Freq: Every day | ORAL | Status: AC

## 2015-04-10 NOTE — Progress Notes (Signed)
Radiation Oncology Follow up Note  Name: Brian Coleman   Date:   04/10/2015 MRN:  [id number] DOB: Jul 03, 1933    This 81 y.o. male presents to the clinic today for follow-up for brain metastasis.  REFERRING PROVIDER: Albina Billet, MD  HPI: Patient is a 81 year old male with early dementia presented with cough found to have a right upper lobe spiculated mass consistent with malignancy. Biopsy-positive non-small cell lung cancer favoring squamous cell. MRI of the brain showed a 2.3 cm enhancing mass in the and we'll aspect the posterior left frontal parietal lobe. He was treated with whole brain plus boost radiation therapy now seen out 2 weeks he continues to have significant cognitive decline. Really is incoherent today and is a is a concern to his family. I have reviewed his PET/CT scan he did have evidence. Of hydronephrosis although I do not believe this could be adding to his cognitive decline. He is having no headaches he's been totally tapered off his steroids.  COMPLICATIONS OF TREATMENT: none  FOLLOW UP COMPLIANCE: keeps appointments   PHYSICAL EXAM:  BP 105/76 mmHg  Pulse 101  Temp(Src) 98 F (36.7 C)  Resp 19  Wt 106 lb 0.7 oz (48.1 kg) Wheelchair-bound male not oriented to place person or time in sharp cognitive decline. Well-developed well-nourished patient in NAD. HEENT reveals PERLA, EOMI, discs not visualized.  Oral cavity is clear. No oral mucosal lesions are identified. Neck is clear without evidence of cervical or supraclavicular adenopathy. Lungs are clear to A&P. Cardiac examination is essentially unremarkable with regular rate and rhythm without murmur rub or thrill. Abdomen is benign with no organomegaly or masses noted. Motor sensory and DTR levels are equal and symmetric in the upper and lower extremities. Cranial nerves II through XII are grossly intact. Proprioception is intact. No peripheral adenopathy or edema is identified. No motor or sensory levels are noted.  Crude visual fields are within normal range.  RADIOLOGY RESULTS: Previous PET CT scan was again reviewed  PLAN: I discussed the case with medical oncology. Family would wish a hospice referral and we are going to see to that. I have also discussed possible doing some a metabolic panel to see if there is any other reason for his swift decline. I'll turn follow-up care at this point over to medical oncology and hospice care. Like to take stopped a plan to participate in this unfortunate patient's care.    Brian Coleman., MD

## 2015-04-11 ENCOUNTER — Ambulatory Visit: Payer: Medicare Other | Admitting: Radiation Oncology

## 2015-04-11 ENCOUNTER — Ambulatory Visit: Payer: Medicare Other

## 2015-04-14 ENCOUNTER — Inpatient Hospital Stay: Payer: Medicare Other | Admitting: Oncology

## 2015-04-14 ENCOUNTER — Telehealth: Payer: Self-pay | Admitting: *Deleted

## 2015-04-14 NOTE — Telephone Encounter (Signed)
  Oncology Nurse Navigator Documentation    Navigator Encounter Type: Telephone (04/14/15 0800)      Discussed Hospice care at length with wife per her request.                 Time Spent with Patient: 15 (04/14/15 0800)

## 2015-04-15 ENCOUNTER — Inpatient Hospital Stay: Payer: Medicare Other | Attending: Oncology | Admitting: Oncology

## 2015-04-15 VITALS — BP 116/81 | HR 99 | Temp 97.8°F | Resp 20

## 2015-04-15 DIAGNOSIS — F0390 Unspecified dementia without behavioral disturbance: Secondary | ICD-10-CM | POA: Diagnosis not present

## 2015-04-15 DIAGNOSIS — F17210 Nicotine dependence, cigarettes, uncomplicated: Secondary | ICD-10-CM | POA: Diagnosis not present

## 2015-04-15 DIAGNOSIS — H9190 Unspecified hearing loss, unspecified ear: Secondary | ICD-10-CM

## 2015-04-15 DIAGNOSIS — R630 Anorexia: Secondary | ICD-10-CM

## 2015-04-15 DIAGNOSIS — Z923 Personal history of irradiation: Secondary | ICD-10-CM | POA: Diagnosis not present

## 2015-04-15 DIAGNOSIS — Z79899 Other long term (current) drug therapy: Secondary | ICD-10-CM

## 2015-04-15 DIAGNOSIS — C3411 Malignant neoplasm of upper lobe, right bronchus or lung: Secondary | ICD-10-CM

## 2015-04-15 DIAGNOSIS — R5383 Other fatigue: Secondary | ICD-10-CM | POA: Diagnosis not present

## 2015-04-15 DIAGNOSIS — R5381 Other malaise: Secondary | ICD-10-CM | POA: Diagnosis not present

## 2015-04-15 DIAGNOSIS — F419 Anxiety disorder, unspecified: Secondary | ICD-10-CM

## 2015-04-15 DIAGNOSIS — R634 Abnormal weight loss: Secondary | ICD-10-CM | POA: Diagnosis not present

## 2015-04-15 DIAGNOSIS — J449 Chronic obstructive pulmonary disease, unspecified: Secondary | ICD-10-CM

## 2015-04-15 NOTE — Progress Notes (Signed)
Patient here today for follow up regarding lung cancer. Patients family here to discuss Hospice care.

## 2015-04-17 ENCOUNTER — Ambulatory Visit: Payer: Medicare Other | Admitting: Oncology

## 2015-04-21 ENCOUNTER — Ambulatory Visit: Payer: Medicare Other | Admitting: Radiation Oncology

## 2015-04-27 NOTE — Progress Notes (Signed)
[place]  Telephone:(336) [phone] Fax:(336) [phone]  ID: Brian Coleman OB: 1934/05/16  MR#: [id number]  HUT#:[id number]  Patient Care Team: Albina Billet, MD as PCP - General (Internal Medicine)  CHIEF COMPLAINT:  Chief Complaint  Patient presents with  . Lung Cancer    follow up    INTERVAL HISTORY: Patient has not been evaluated since September 2016. He has now completed XRT to his brain. His performance status has significantly declined and his dementia is worsening. Patient's family recently requested hospice care. The entire history is given by patient's wife given his underlying dementia. He has a poor appetite. There is no report of fevers. He does not complain of pain. He continues to remain significantly confused. Patient's wife reports no further complaints.   REVIEW OF SYSTEMS:   Review of Systems  Constitutional: Positive for weight loss and malaise/fatigue. Negative for fever.  HENT: Negative.   Respiratory: Positive for shortness of breath.   Cardiovascular: Negative.   Gastrointestinal: Negative.   Neurological: Negative.  Negative for weakness.  Psychiatric/Behavioral: Positive for memory loss. The patient is nervous/anxious.     As per HPI. Otherwise, a complete review of systems is negatve.  PAST MEDICAL HISTORY: Past Medical History  Diagnosis Date  . Cataract   . COPD (chronic obstructive pulmonary disease) ([place])   . Shortness of breath dyspnea   . HOH (hard of hearing)     PAST SURGICAL HISTORY: Past Surgical History  Procedure Laterality Date  . Knee surgery    . Back surgery    . Cataract extraction    . Shoulder surgery    . Electromagnetic navigation brochoscopy N/A 02/06/2015    Procedure: ELECTROMAGNETIC NAVIGATION BRONCHOSCOPY;  Surgeon: Flora Lipps, MD;  Location: [facility] ORS;  Service: Cardiopulmonary;  Laterality: N/A;    FAMILY HISTORY: Reviewed and unchanged. No reported history of malignancy or chronic  disease.     ADVANCED DIRECTIVES:    HEALTH MAINTENANCE: Social History  Substance Use Topics  . Smoking status: Current Every Day Smoker -- 0.75 packs/day for 60 years    Types: Cigarettes  . Smokeless tobacco: Never Used  . Alcohol Use: No     Colonoscopy:  PAP:  Bone density:  Lipid panel:  Allergies  Allergen Reactions  . Codeine     hallucinations    Current Outpatient Prescriptions  Medication Sig Dispense Refill  . albuterol (PROVENTIL HFA;VENTOLIN HFA) 108 (90 BASE) MCG/ACT inhaler Inhale 2 puffs into the lungs every 6 (six) hours as needed for wheezing or shortness of breath. 1 Inhaler 2  . aspirin 81 MG tablet Take 81 mg by mouth daily.    Marland Kitchen dexamethasone (DECADRON) 2 MG tablet Take 1 tablet (2 mg total) by mouth daily. Until October 30, then start 1 tablet every other day. 10 tablet 0  . dexamethasone (DECADRON) 4 MG tablet Take 1 tablet (4 mg total) by mouth daily. 30 tablet 0  . esomeprazole (NEXIUM) 40 MG capsule Take 1 capsule (40 mg total) by mouth daily at 12 noon. 30 capsule 1  . fluticasone-salmeterol (ADVAIR HFA) 230-21 MCG/ACT inhaler Inhale 2 puffs into the lungs 2 (two) times daily. 1 Inhaler 12  . lactose free nutrition (BOOST) LIQD Take 237 mLs by mouth daily.    . megestrol (MEGACE) 40 MG/ML suspension Take 10 mLs (400 mg total) by mouth daily. 240 mL 1  . mometasone-formoterol (DULERA) 200-5 MCG/ACT AERO Inhale 2 puffs into the lungs 2 (two) times daily. 1 Inhaler 12  .  tiotropium (SPIRIVA HANDIHALER) 18 MCG inhalation capsule Place 1 capsule (18 mcg total) into inhaler and inhale daily. 30 capsule 2   No current facility-administered medications for this visit.    OBJECTIVE: Filed Vitals:   04/15/15 1709  BP: 116/81  Pulse: 99  Temp: 97.8 F (36.6 C)  Resp: 20     There is no weight on file to calculate BMI.    ECOG FS:2 - Symptomatic, <50% confined to bed  General: Thin, no acute distress. Eyes: Pink conjunctiva, anicteric  sclera. Lungs: Clear to auscultation bilaterally. Heart: Regular rate and rhythm. No rubs, murmurs, or gallops. Abdomen: Soft, nontender, nondistended. No organomegaly noted, normoactive bowel sounds. Musculoskeletal: No edema, cyanosis, or clubbing. Neuro: Alert but confused. Skin: No rashes or petechiae noted. Psych: Normal affect.   LAB RESULTS:  No results found for: NA, K, CL, CO2, GLUCOSE, BUN, CREATININE, CALCIUM, PROT, ALBUMIN, AST, ALT, ALKPHOS, BILITOT, GFRNONAA, GFRAA  No results found for: WBC, NEUTROABS, HGB, HCT, MCV, PLT   STUDIES: No results found.  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   PLAN:    1. Lung cancer: Patient has now completed XRT to his brain. Given his declining performance status and underlying dementia, no further treatments are planned and patient has been enrolled in hospice care. No further follow-up is necessary.   Approximately 30 minutes was spent in discussion and of which greater than 50% was consultation.   Lloyd Huger, MD   04/27/2015 9:09 AM

## 2015-05-13 ENCOUNTER — Telehealth: Payer: Self-pay | Admitting: *Deleted

## 2015-05-13 NOTE — Telephone Encounter (Signed)
Called to report that pt fell over the weekend as he frequently does, Also that his O2 sat is 80%. He is not in distress, he has O2 and Nebulizer at home which he does not utilize very often. He has only used the nebulizer once since he got it and he will only wear the O2 for an hour. He is using his rescue inhalers. She discussed with the wife to use nebulizer and put O2 back on after a short break

## 2015-07-09 DEATH — deceased

## 2016-03-16 IMAGING — CT CT CHEST W/ CM
2 of 4 series · 15 of 36 positions shown, 18 images · IV contrast (omnipaque)
Comparison: Chest radiographs 01/09/2015.

CLINICAL DATA: Irregular right upper lobe mass on chest
radiographs, concerning for malignancy. No history of malignancy.
Patient fell 2 weeks ago. Initial encounter.

EXAM:
CT CHEST WITH CONTRAST
TECHNIQUE: Multidetector CT imaging of the chest was performed during
intravenous contrast administration.
CONTRAST:  75mL OMNIPAQUE IOHEXOL 300 MG/ML  SOLN

[Series 2: routine chest with · axial · 0.74mm/px · z∈[-754,-469]mm · 12 of 64 slices shown, 15 images]
[im 4/64  mediastinal]
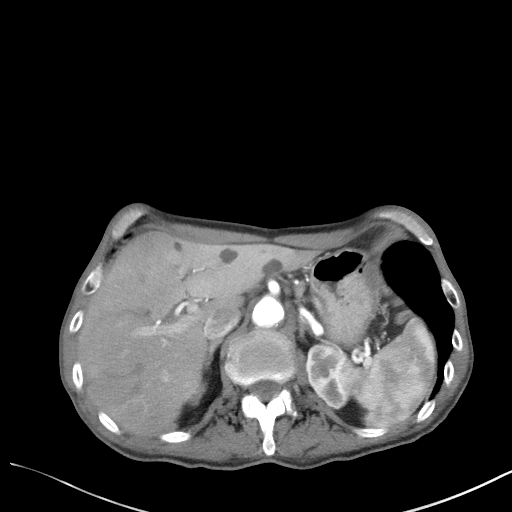
[im 4/64  lung]
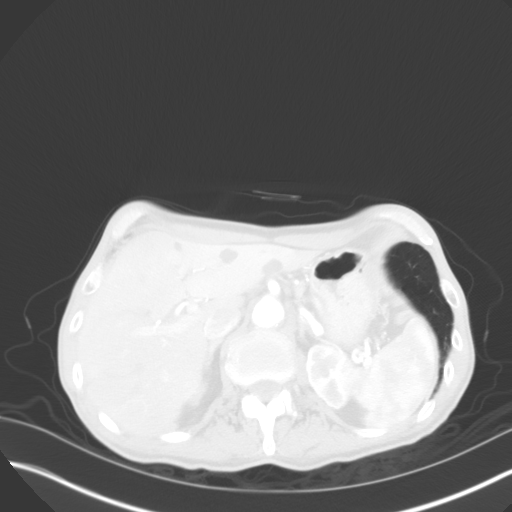
[im 10/64  lung]
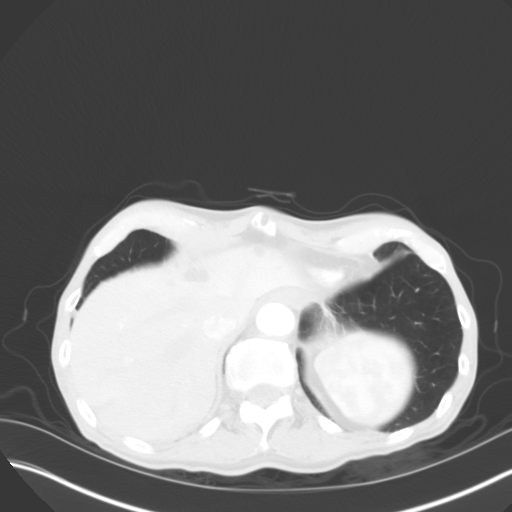
[im 16/64  lung]
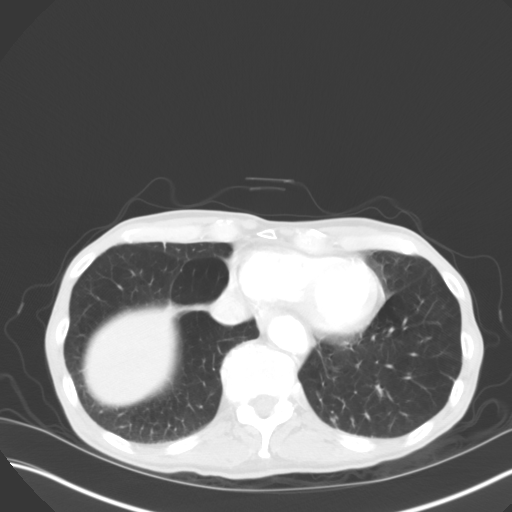
[im 19/64  lung]
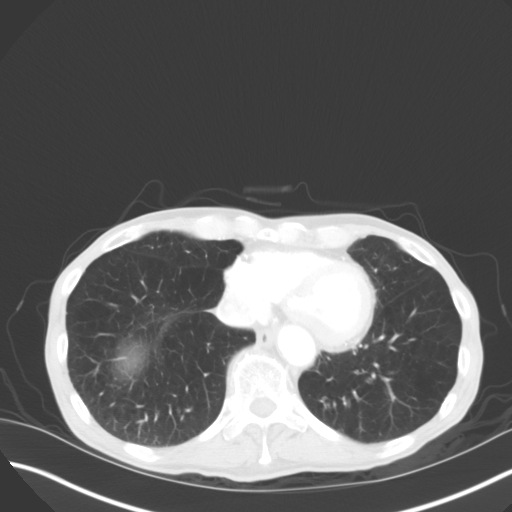
[im 25/64  mediastinal]
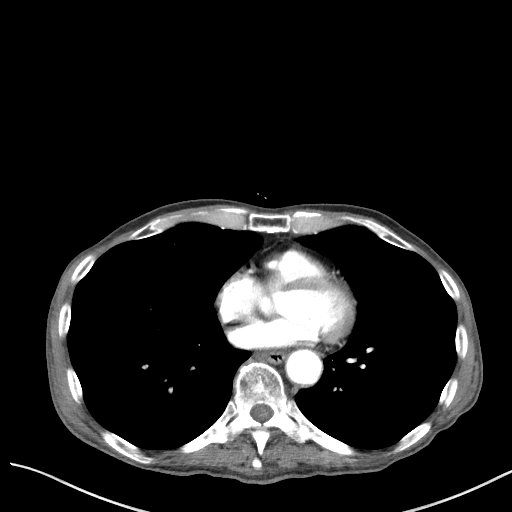
[im 25/64  lung]
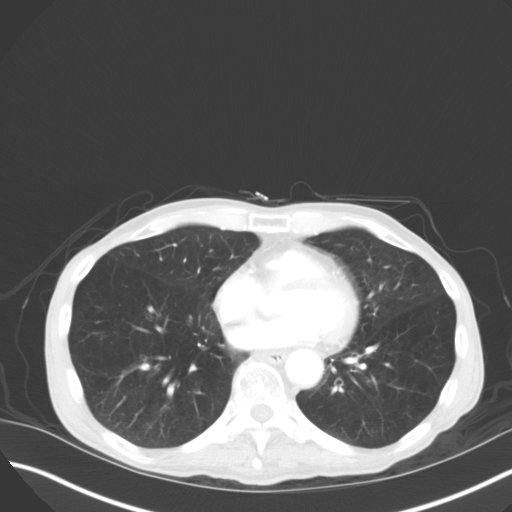
[im 31/64  lung]
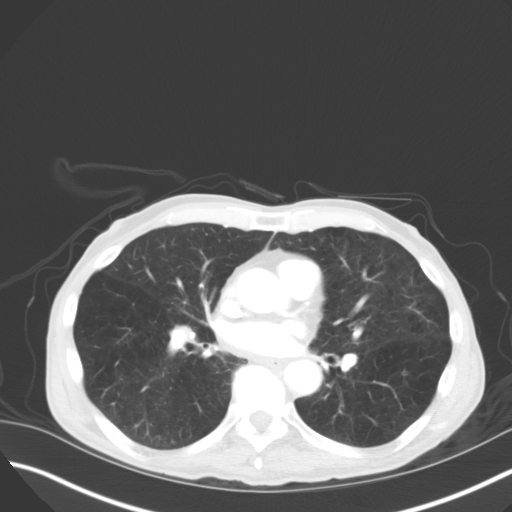
[im 34/64  lung]
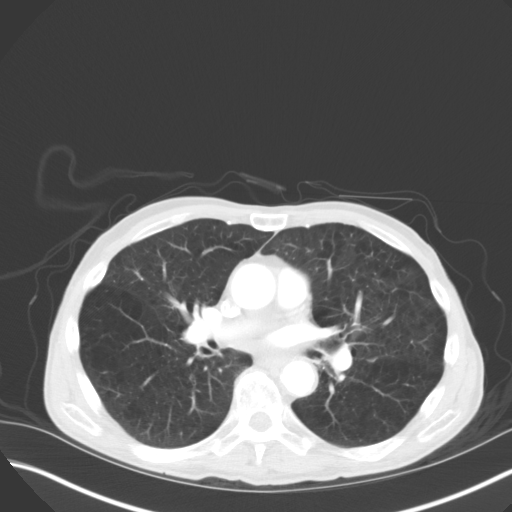
[im 40/64  lung]
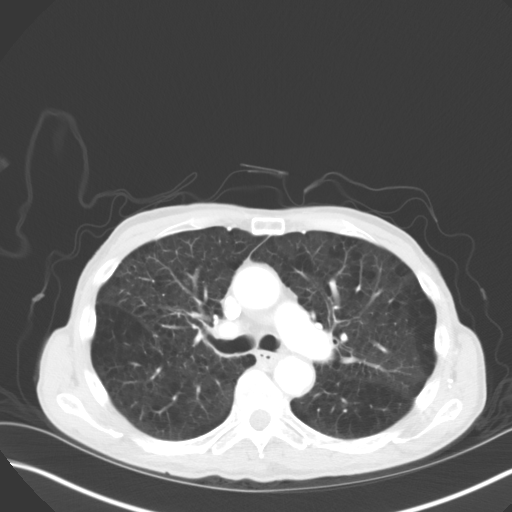
[im 46/64  mediastinal]
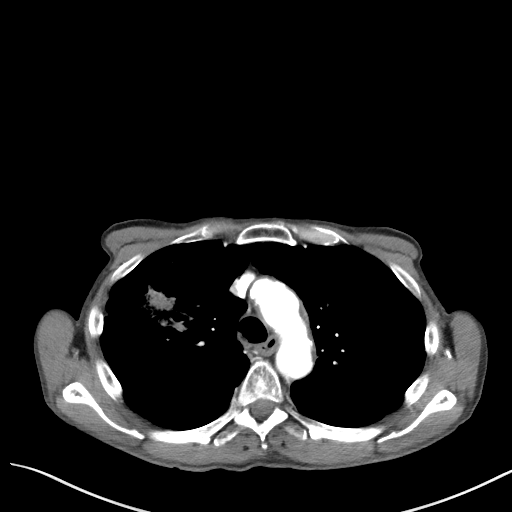
[im 46/64  lung]
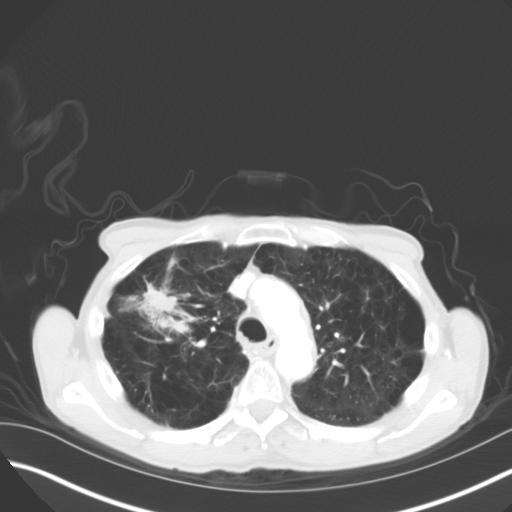
[im 49/64  lung]
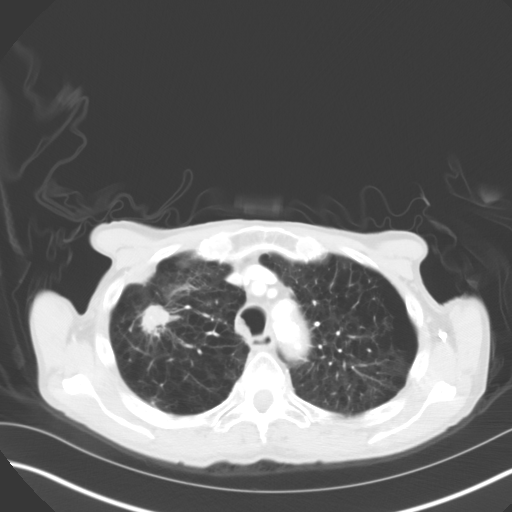
[im 55/64  lung]
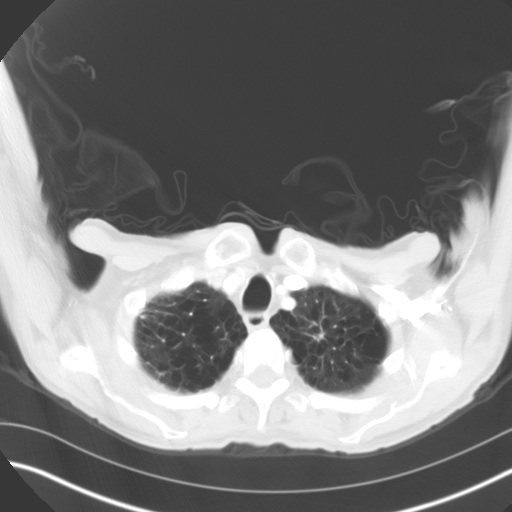
[im 61/64  lung]
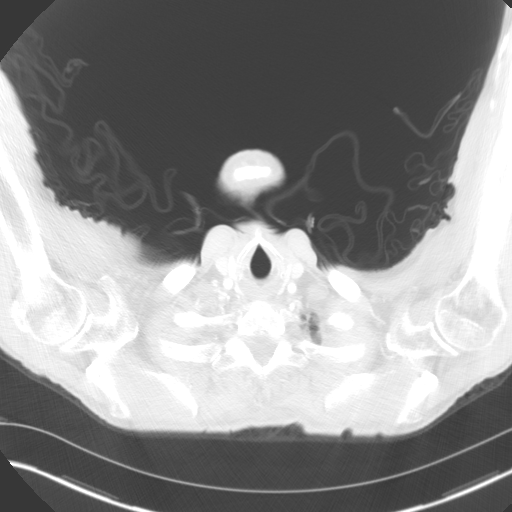

[Series 5: cor routine chest with · coronal · 0.63mm/px · 3 of 104 slices shown]
[im 21/104  lung]
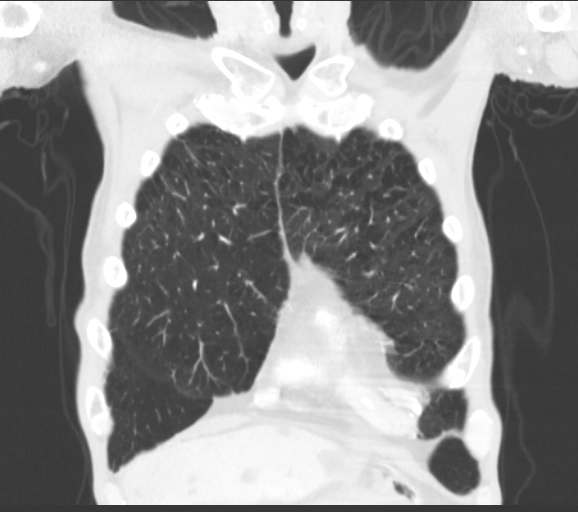
[im 42/104  lung]
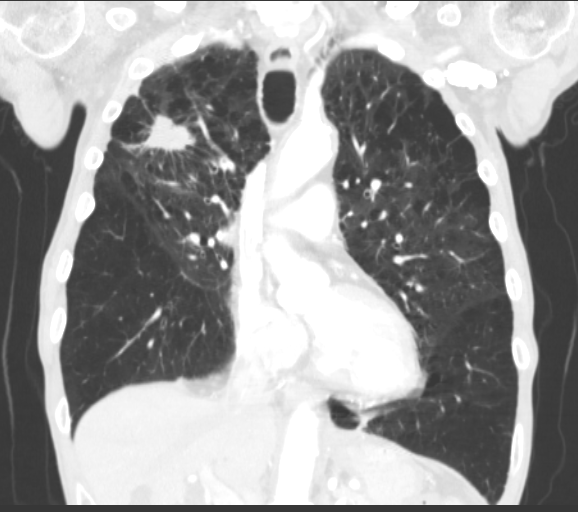
[im 62/104  lung]
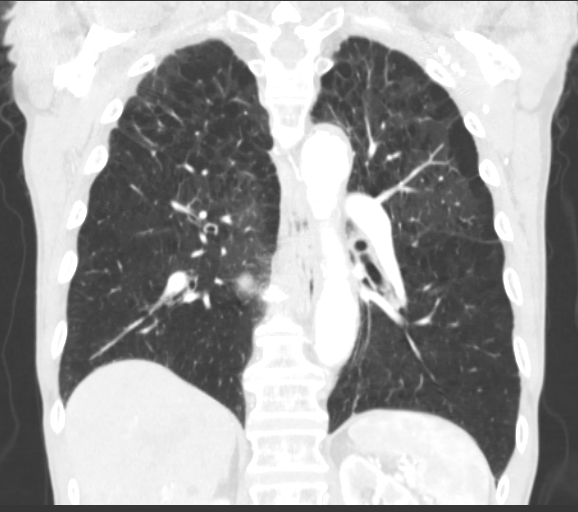

[15 of 36 positions shown; findings below may reference images not displayed]

FINDINGS: Mediastinum/Nodes: There are no enlarged mediastinal, hilar or
axillary lymph nodes. There are probable dependent secretions within
the trachea and right mainstem bronchus. The thyroid gland and
esophagus demonstrate no significant findings. The heart size is
normal. There is no pericardial effusion. There is atherosclerosis
of the aorta, great vessels and coronary arteries.

Lungs/Pleura: There is no pleural effusion. Moderate diffuse changes
of centrilobular and paraseptal emphysema. As demonstrated
radiographically, there is an irregular right upper lobe mass which
measures 3.3 x 4.2 x 2.9 cm, consistent with bronchogenic carcinoma.
No other suspicious pulmonary nodules. There is mild central airway
thickening.

Upper abdomen: Multiple well-circumscribed water density hepatic
lesions are consistent with cysts. Some of these are too small to
optimally characterize, but no suspicious lesions demonstrated. No
evidence of adrenal mass.

Musculoskeletal/Chest wall: There is no chest wall mass or
suspicious osseous finding. Mild thoracic spine degenerative changes
are present.
IMPRESSION: 1. Spiculated right upper lobe mass consistent with bronchogenic
carcinoma. No evidence of metastatic disease.
2. Moderate underlying emphysema.
3. Mild atherosclerosis.
4. Probable hepatic cysts.
